# Patient Record
Sex: Female | Born: 1965 | Race: White | Hispanic: No | Smoking: Former smoker
Health system: Southern US, Community
[De-identification: ages and names within clinical notes are randomized; demographics above are authoritative.]

## PROBLEM LIST (undated history)

## (undated) DIAGNOSIS — F410 Panic disorder [episodic paroxysmal anxiety] without agoraphobia: Secondary | ICD-10-CM

## (undated) DIAGNOSIS — E119 Type 2 diabetes mellitus without complications: Secondary | ICD-10-CM

## (undated) DIAGNOSIS — Z87442 Personal history of urinary calculi: Secondary | ICD-10-CM

## (undated) DIAGNOSIS — D649 Anemia, unspecified: Secondary | ICD-10-CM

## (undated) DIAGNOSIS — M1990 Unspecified osteoarthritis, unspecified site: Secondary | ICD-10-CM

## (undated) DIAGNOSIS — I10 Essential (primary) hypertension: Secondary | ICD-10-CM

## (undated) DIAGNOSIS — J189 Pneumonia, unspecified organism: Secondary | ICD-10-CM

## (undated) DIAGNOSIS — R0600 Dyspnea, unspecified: Secondary | ICD-10-CM

## (undated) HISTORY — PX: WISDOM TOOTH EXTRACTION: SHX21

## (undated) HISTORY — PX: BREAST SURGERY: SHX581

---

## 2004-03-29 ENCOUNTER — Emergency Department: Payer: Self-pay | Admitting: Emergency Medicine

## 2004-04-01 ENCOUNTER — Emergency Department: Payer: Self-pay | Admitting: Emergency Medicine

## 2007-09-22 ENCOUNTER — Encounter: Admission: RE | Admit: 2007-09-22 | Discharge: 2007-09-22 | Payer: Self-pay | Admitting: Family Medicine

## 2010-03-05 ENCOUNTER — Encounter (HOSPITAL_BASED_OUTPATIENT_CLINIC_OR_DEPARTMENT_OTHER)
Admission: RE | Admit: 2010-03-05 | Discharge: 2010-03-05 | Disposition: A | Discharge status: Home / Self Care | Payer: BC Managed Care – PPO | Source: Ambulatory Visit | Attending: Surgery | Admitting: Surgery

## 2010-03-05 DIAGNOSIS — Z01812 Encounter for preprocedural laboratory examination: Secondary | ICD-10-CM | POA: Insufficient documentation

## 2010-03-05 DIAGNOSIS — Z01810 Encounter for preprocedural cardiovascular examination: Secondary | ICD-10-CM | POA: Insufficient documentation

## 2010-03-05 LAB — BASIC METABOLIC PANEL
BUN: 11 mg/dL (ref 6–23)
CO2: 29 mEq/L (ref 19–32)
Chloride: 101 mEq/L (ref 96–112)
Creatinine, Ser: 0.85 mg/dL (ref 0.4–1.2)
Potassium: 4.8 mEq/L (ref 3.5–5.1)

## 2010-03-06 ENCOUNTER — Other Ambulatory Visit: Payer: Self-pay | Admitting: Surgery

## 2010-03-06 ENCOUNTER — Ambulatory Visit (HOSPITAL_BASED_OUTPATIENT_CLINIC_OR_DEPARTMENT_OTHER)
Admission: RE | Admit: 2010-03-06 | Discharge: 2010-03-06 | Disposition: A | Discharge status: Home / Self Care | Payer: BC Managed Care – PPO | Source: Ambulatory Visit | Attending: Surgery | Admitting: Surgery

## 2010-03-06 DIAGNOSIS — K219 Gastro-esophageal reflux disease without esophagitis: Secondary | ICD-10-CM | POA: Insufficient documentation

## 2010-03-06 DIAGNOSIS — J449 Chronic obstructive pulmonary disease, unspecified: Secondary | ICD-10-CM | POA: Insufficient documentation

## 2010-03-06 DIAGNOSIS — N61 Mastitis without abscess: Secondary | ICD-10-CM | POA: Insufficient documentation

## 2010-03-06 DIAGNOSIS — Z01812 Encounter for preprocedural laboratory examination: Secondary | ICD-10-CM | POA: Insufficient documentation

## 2010-03-06 DIAGNOSIS — F17200 Nicotine dependence, unspecified, uncomplicated: Secondary | ICD-10-CM | POA: Insufficient documentation

## 2010-03-06 DIAGNOSIS — E669 Obesity, unspecified: Secondary | ICD-10-CM | POA: Insufficient documentation

## 2010-03-06 DIAGNOSIS — Z01818 Encounter for other preprocedural examination: Secondary | ICD-10-CM | POA: Insufficient documentation

## 2010-03-06 DIAGNOSIS — J4489 Other specified chronic obstructive pulmonary disease: Secondary | ICD-10-CM | POA: Insufficient documentation

## 2010-03-06 DIAGNOSIS — I10 Essential (primary) hypertension: Secondary | ICD-10-CM | POA: Insufficient documentation

## 2010-03-08 LAB — WOUND CULTURE: Gram Stain: NONE SEEN

## 2010-03-11 LAB — ANAEROBIC CULTURE

## 2010-03-11 NOTE — Op Note (Signed)
  NAME:  Elizabeth Daniel, Elizabeth Daniel NO.:  [id number]  MEDICAL RECORD NO.:  [id number]           PATIENT TYPE:  LOCATION:                                 FACILITY:  PHYSICIAN:  Thornton Park. Daphine Deutscher, MD       DATE OF BIRTH:  DATE OF PROCEDURE:  03/06/2010 DATE OF DISCHARGE:                              OPERATIVE REPORT   PREOPERATIVE DIAGNOSIS:  Recurrent right breast mastitis.  POSTOPERATIVE DIAGNOSIS:  Recurrent right breast mastitis.  DESCRIPTION OF PROCEDURE:  This 45 year old white female was taken to room 80 [facility].  We marked the area of her breast which was pretty obvious.  She has had recurrent changes there and had some question she had Paget disease or mastitis and since I saw her last she had actually had a bout of infection drained by Dr. Michaell Cowing in my office. After I prepped her breast with PC max and draped it, I created an ellipse of tissue excising the applied spot of drainage and getting someof the skin change and then cutting down and then undermining underneath the nipple to get this indurated mass tissue that was probably mastitis. I removed it beneath that quadrant of the nipple.  This incision ran from about the 11 o'clock to 2 o'clock positions on the right side.  I went ahead and cut it out in toto.  There is also a drop of purulent- like material came out through the center portion of the nipple and cultured that for aerobes and anaerobes.  I went ahead and had cut back to fresh tissue.  I went ahead and irrigated well and I cauterized so there was no bleeding.  After irrigating with several bulb syringes of saline, I went ahead and closed it from either end toward the middle with 4-0 Vicryl and then I packed through the center portion with a piece of 0.25% Iodoform gauze which I left inside the breast about probably an 1-1/2 and then left a bit at least an inch on the outside. I instructed her mother to have her remove the dressing tomorrow  and pull that out with it.  I will keep her on some Keflex 500 mg twice a day for a week and we will see her back in the office in 2-3 weeks. Also given was a prescription for Tylox to take for pain.  Condition improved.     Thornton Park Daphine Deutscher, MD     MBM/MEDQ  D:  03/06/2010  T:  03/06/2010  Job:  [id number]  cc:   Aida Puffer  Electronically Signed by Luretha Murphy MD on 03/11/2010 01:35:21 PM

## 2013-04-03 ENCOUNTER — Emergency Department (HOSPITAL_COMMUNITY)
Admission: EM | Admit: 2013-04-03 | Discharge: 2013-04-03 | Disposition: A | Discharge status: Home / Self Care | Payer: BC Managed Care – PPO | Attending: Emergency Medicine | Admitting: Emergency Medicine

## 2013-04-03 ENCOUNTER — Encounter (HOSPITAL_COMMUNITY): Payer: Self-pay | Admitting: Emergency Medicine

## 2013-04-03 ENCOUNTER — Emergency Department (HOSPITAL_COMMUNITY): Payer: BC Managed Care – PPO

## 2013-04-03 DIAGNOSIS — I10 Essential (primary) hypertension: Secondary | ICD-10-CM | POA: Insufficient documentation

## 2013-04-03 DIAGNOSIS — Z880 Allergy status to penicillin: Secondary | ICD-10-CM | POA: Insufficient documentation

## 2013-04-03 DIAGNOSIS — Z87891 Personal history of nicotine dependence: Secondary | ICD-10-CM | POA: Insufficient documentation

## 2013-04-03 DIAGNOSIS — M940 Chondrocostal junction syndrome [Tietze]: Secondary | ICD-10-CM | POA: Insufficient documentation

## 2013-04-03 DIAGNOSIS — E663 Overweight: Secondary | ICD-10-CM | POA: Insufficient documentation

## 2013-04-03 DIAGNOSIS — Z79899 Other long term (current) drug therapy: Secondary | ICD-10-CM | POA: Insufficient documentation

## 2013-04-03 HISTORY — DX: Essential (primary) hypertension: I10

## 2013-04-03 LAB — URINALYSIS, ROUTINE W REFLEX MICROSCOPIC
BILIRUBIN URINE: NEGATIVE
Glucose, UA: NEGATIVE mg/dL
Hgb urine dipstick: NEGATIVE
KETONES UR: NEGATIVE mg/dL
Leukocytes, UA: NEGATIVE
NITRITE: NEGATIVE
Protein, ur: NEGATIVE mg/dL
SPECIFIC GRAVITY, URINE: 1.01 (ref 1.005–1.030)
UROBILINOGEN UA: 0.2 mg/dL (ref 0.0–1.0)
pH: 6.5 (ref 5.0–8.0)

## 2013-04-03 MED ORDER — OXYCODONE-ACETAMINOPHEN 5-325 MG PO TABS
1.0000 | ORAL_TABLET | Freq: Once | ORAL | Status: AC
  Administered 2013-04-03: 1 via ORAL
  Filled 2013-04-03: qty 1

## 2013-04-03 MED ORDER — IBUPROFEN 800 MG PO TABS: 800.0000 mg | ORAL_TABLET | Freq: Three times a day (TID) | ORAL | Status: DC

## 2013-04-03 MED ORDER — OXYCODONE-ACETAMINOPHEN 5-325 MG PO TABS: 2.0000 | ORAL_TABLET | Freq: Four times a day (QID) | ORAL | Status: DC | PRN

## 2013-04-03 NOTE — ED Notes (Signed)
Pt from home c/o right sided pain that is worse with deep breaths that began Thursday. She denies injury but reports sitting more to one side then having the pain.

## 2013-04-03 NOTE — ED Provider Notes (Signed)
CSN: [id number]     Arrival date & time 04/03/13  1142 History   First MD Initiated Contact with Patient 04/03/13 1219     Chief Complaint  Patient presents with  . Flank Pain     (Consider location/radiation/quality/duration/timing/severity/associated sxs/prior Treatment) The history is provided by the patient.  Elizabeth Daniel is a 48 y.o. female hx of HTN here with R side pain. She was visiting someone in the hospital 3 days ago and was leaning on the right side and was reading a book for several hours. Afterwards she notes some pain when she takes a deep breath. Has been taking Tylenol with minimal relief. Denies shortness of breath when she sits. Eyes any recent travels or history of blood clots. Denies any abdominal pain or nausea or vomiting. Denies any hematuria or dysuria. Pain is not worse when she eats.   Past Medical History  Diagnosis Date  . Hypertension    History reviewed. No pertinent past surgical history. No family history on file. History  Substance Use Topics  . Smoking status: Former Smoker -- 20 years    Types: Cigarettes  . Smokeless tobacco: Not on file  . Alcohol Use: No   OB History   Grav Para Term Preterm Abortions TAB SAB Ect Mult Living                 Review of Systems  Musculoskeletal:       R side pain   All other systems reviewed and are negative.      Allergies  Penicillins  Home Medications   Current Outpatient Rx  Name  Route  Sig  Dispense  Refill  . ALPRAZolam (XANAX) 1 MG tablet   Oral   Take 1 mg by mouth 3 (three) times daily as needed for anxiety.          . Azilsartan Medoxomil (EDARBI) 40 MG TABS   Oral   Take 20 mg by mouth every evening.         . hydrochlorothiazide (HYDRODIURIL) 25 MG tablet   Oral   Take 25 mg by mouth daily.           BP 140/84  Pulse 95  Temp(Src) 98.6 F (37 C) (Oral)  Resp 18  SpO2 95% Physical Exam  Nursing note and vitals reviewed. Constitutional: She is oriented to  person, place, and time. She appears well-developed and well-nourished.  Overweight, slightly uncomfortable   HENT:  Head: Normocephalic.  Mouth/Throat: Oropharynx is clear and moist.  Eyes: Conjunctivae are normal. Pupils are equal, round, and reactive to light.  Neck: Normal range of motion. Neck supple.  Cardiovascular: Normal rate, regular rhythm and normal heart sounds.   Pulmonary/Chest: Effort normal and breath sounds normal. No respiratory distress. She has no wheezes. She has no rales.  + reproducible tenderness R 12th rib area.   Abdominal: Soft. Bowel sounds are normal. She exhibits no distension. There is no tenderness. There is no rebound.  No RUQ tenderness, no murphy's sign   Musculoskeletal: Normal range of motion. She exhibits no edema and no tenderness.  Neurological: She is alert and oriented to person, place, and time. No cranial nerve deficit. Coordination normal.  Skin: Skin is warm and dry.  Psychiatric: She has a normal mood and affect. Her behavior is normal. Judgment and thought content normal.    ED Course  Procedures (including critical care time)   EMERGENCY DEPARTMENT US GALLBLADDER INTERPRETATION "Study: Limited Ultrasound of the gallbladder and  common bile duct."  INDICATIONS: Abdominal pain Indication: Multiple views of the gallbladder and common bile duct are obtained with a  Multi-frequency probe."  PERFORMED BY:  Myself  IMAGES ARCHIVED?: Yes  FINDINGS: Gallstones present, Gallbladder wall normal in thickness, Sonographic Murphy's sign absent and Common bile duct normal in size  LIMITATIONS: Body Habitus  INTERPRETATION: Cholelithiasis  COMMENT:  Single gallstone, no cholecystitis    EMERGENCY DEPARTMENT US RENAL INTERPRETATION "Study: Limited Ultrasound of the gallbladder and common bile duct."  INDICATIONS: RUQ pain Indication: Multiple views of the R kidney are obtained with a  Multi-frequency probe."  PERFORMED BY:   Myself  IMAGES ARCHIVED?: Yes  FINDINGS: R kidney with no hydro  LIMITATIONS: Body Habitus  INTERPRETATION: Normal  COMMENT:  No hydro   Labs Review Labs Reviewed  URINALYSIS, ROUTINE W REFLEX MICROSCOPIC - Abnormal; Notable for the following:    APPearance CLOUDY (*)    All other components within normal limits   Imaging Review Dg Ribs Unilateral W/chest Right  04/03/2013   CLINICAL DATA:  Right rib pain on lower right ribs  EXAM: RIGHT RIBS AND CHEST - 3+ VIEW  COMPARISON:  None.  FINDINGS: No fracture or other bone lesions are seen involving the ribs. There is no evidence of pneumothorax or pleural effusion. Both lungs are clear. Heart size and mediastinal contours are within normal limits. No pneumothorax.  IMPRESSION: No acute findings.   Electronically Signed   By: Charlett NoseKevin  Daniel M.D.   On: 04/03/2013 13:04     EKG Interpretation None      MDM   Final diagnoses:  None   Elizabeth NajjarLaurie Daniel is a 48 y.o. female here with R rib pain s/p leaning on it. I think likely costochondritis. Will get xrays. Will get UA to assess for hematuria although I have low suspicion for stone. I doubt gallbladder pathology. PERC neg, I doubt PE. I also don't think she has ACS or dissection.    1:30 PM Xray unremarkable. UA nl, no hydro R kidney. Single gallstone at the top of the gallbladder with no signs of acute chole. I don't think it explains her symptoms. Likely costochondritis. Will give pain meds and have her f/u with PMD.   Elizabeth Canalavid H Jayvien Rowlette, MD 04/03/13 1331

## 2013-04-03 NOTE — Discharge Instructions (Signed)
Take motrin 800 mg every 6 hrs for the next few days then as needed.   Take percocet as prescribed for severe pain. Do NOT drive with it.   You have a single gallstone but it is not causing any inflammation of your gallbladder.   Follow up with your doctor.   Return to ER if you have severe pain, vomiting, fevers.    Costochondritis Costochondritis is a condition in which the tissue (cartilage) that connects your ribs with your breastbone (sternum) becomes irritated. It causes pain in the chest and rib area. It usually goes away on its own over time. HOME CARE  Avoid activities that wear you out.  Do not strain your ribs. Avoid activities that use your:  Chest.  Belly.  Side muscles.  Put ice on the area for the first 2 days after the pain starts.  Put ice in a plastic bag.  Place a towel between your skin and the bag.  Leave the ice on for 20 minutes, 2 3 times a day.  Only take medicine as told by your doctor. GET HELP IF:  You have redness or puffiness (swelling) in the rib area.  Your pain does not go away with rest or medicine. GET HELP RIGHT AWAY IF:   Your pain gets worse.  You are very uncomfortable.  You have trouble breathing.  You cough up blood.  You start sweating or throwing up (vomiting).  You have a fever or lasting symptoms for more than 2 3 days.  You have a fever and your symptoms suddenly get worse. MAKE SURE YOU:   Understand these instructions.  Will watch your condition.  Will get help right away if you are not doing well or get worse. Document Released: 06/11/2007 Document Revised: 08/25/2012 Document Reviewed: 07/27/2012 [facility] Patient Information 2014 [place], [place].

## 2016-02-13 ENCOUNTER — Encounter (HOSPITAL_COMMUNITY): Payer: Self-pay | Admitting: *Deleted

## 2016-02-13 ENCOUNTER — Emergency Department (HOSPITAL_COMMUNITY)
Admission: EM | Admit: 2016-02-13 | Discharge: 2016-02-13 | Disposition: A | Discharge status: Home / Self Care | Payer: BLUE CROSS/BLUE SHIELD | Attending: Emergency Medicine | Admitting: Emergency Medicine

## 2016-02-13 ENCOUNTER — Other Ambulatory Visit: Payer: Self-pay

## 2016-02-13 ENCOUNTER — Emergency Department (HOSPITAL_COMMUNITY): Payer: BLUE CROSS/BLUE SHIELD

## 2016-02-13 DIAGNOSIS — I10 Essential (primary) hypertension: Secondary | ICD-10-CM | POA: Diagnosis not present

## 2016-02-13 DIAGNOSIS — R0689 Other abnormalities of breathing: Secondary | ICD-10-CM | POA: Diagnosis not present

## 2016-02-13 DIAGNOSIS — M7989 Other specified soft tissue disorders: Secondary | ICD-10-CM | POA: Diagnosis present

## 2016-02-13 DIAGNOSIS — R609 Edema, unspecified: Secondary | ICD-10-CM

## 2016-02-13 DIAGNOSIS — Z87891 Personal history of nicotine dependence: Secondary | ICD-10-CM | POA: Diagnosis not present

## 2016-02-13 DIAGNOSIS — Z79899 Other long term (current) drug therapy: Secondary | ICD-10-CM | POA: Insufficient documentation

## 2016-02-13 DIAGNOSIS — E119 Type 2 diabetes mellitus without complications: Secondary | ICD-10-CM | POA: Diagnosis not present

## 2016-02-13 DIAGNOSIS — R600 Localized edema: Secondary | ICD-10-CM | POA: Diagnosis not present

## 2016-02-13 HISTORY — DX: Type 2 diabetes mellitus without complications: E11.9

## 2016-02-13 HISTORY — DX: Panic disorder (episodic paroxysmal anxiety): F41.0

## 2016-02-13 LAB — BASIC METABOLIC PANEL
Anion gap: 10 (ref 5–15)
BUN: <5 mg/dL — ABNORMAL LOW (ref 6–20)
CO2: 26 mmol/L (ref 22–32)
Calcium: 9.2 mg/dL (ref 8.9–10.3)
Chloride: 104 mmol/L (ref 101–111)
Creatinine, Ser: 0.58 mg/dL (ref 0.44–1.00)
GFR calc Af Amer: >60 mL/min (ref >60)
GFR calc non Af Amer: >60 mL/min (ref >60)
Glucose, Bld: 183 mg/dL — ABNORMAL HIGH (ref 65–99)
Potassium: 3.7 mmol/L (ref 3.5–5.1)
Sodium: 140 mmol/L (ref 135–145)

## 2016-02-13 LAB — URINALYSIS, ROUTINE W REFLEX MICROSCOPIC
Bilirubin Urine: NEGATIVE
Glucose, UA: NEGATIVE mg/dL
Ketones, ur: NEGATIVE mg/dL
Nitrite: NEGATIVE
Protein, ur: NEGATIVE mg/dL
Specific Gravity, Urine: 1.006 (ref 1.005–1.030)
pH: 6 (ref 5.0–8.0)

## 2016-02-13 LAB — CBC WITH DIFFERENTIAL/PLATELET
Basophils Absolute: 0 10*3/uL (ref 0.0–0.1)
Basophils Relative: 1 %
Eosinophils Absolute: 0.1 10*3/uL (ref 0.0–0.7)
Eosinophils Relative: 2 %
HCT: 38.1 % (ref 36.0–46.0)
Hemoglobin: 12.5 g/dL (ref 12.0–15.0)
Lymphocytes Relative: 25 %
Lymphs Abs: 1.9 10*3/uL (ref 0.7–4.0)
MCH: 30.9 pg (ref 26.0–34.0)
MCHC: 32.8 g/dL (ref 30.0–36.0)
MCV: 94.3 fL (ref 78.0–100.0)
Monocytes Absolute: 0.6 10*3/uL (ref 0.1–1.0)
Monocytes Relative: 8 %
Neutro Abs: 4.8 10*3/uL (ref 1.7–7.7)
Neutrophils Relative %: 64 %
Platelets: 185 10*3/uL (ref 150–400)
RBC: 4.04 MIL/uL (ref 3.87–5.11)
RDW: 12.8 % (ref 11.5–15.5)
WBC: 7.5 10*3/uL (ref 4.0–10.5)

## 2016-02-13 LAB — BRAIN NATRIURETIC PEPTIDE: B Natriuretic Peptide: 87.2 pg/mL (ref 0.0–100.0)

## 2016-02-13 MED ORDER — POTASSIUM CHLORIDE CRYS ER 20 MEQ PO TBCR: 20.0000 meq | EXTENDED_RELEASE_TABLET | Freq: Two times a day (BID) | ORAL | 0 refills | Status: DC

## 2016-02-13 MED ORDER — FUROSEMIDE 20 MG PO TABS: 20.0000 mg | ORAL_TABLET | Freq: Two times a day (BID) | ORAL | 0 refills | Status: DC

## 2016-02-13 NOTE — ED Provider Notes (Signed)
[facility]-EMERGENCY DEPT Provider Note    By signing my name below, I, Earmon Phoenix, attest that this documentation has been prepared under the direction and in the presence of Raeford Razor, MD. Electronically Signed: Earmon Phoenix, ED Scribe. 02/13/16. 3:30 PM.    History   Chief Complaint Chief Complaint  Patient presents with  . Leg Swelling  . Cough    HPI  Elizabeth Daniel is a 51 y.o. female with PMHx of DM, HTN who presents to the Emergency Department complaining of fluid retention in all extremities, especially feet that began about one week ago. She also reports cold symptoms including orthopnea, congestion, cough. She also reports SOB but also notes her recent cold symptoms. She states she was taking HCTZ for several years but was getting severe cramping in all extremities. Here PCP discontinued this recently and has been swelling since. She has not taken anything for symptom relief. She denies modifying factors. She denies fever, chills, nausea, vomiting. She has an appt with her PCP in two day on 02/15/16.   Past Medical History:  Diagnosis Date  . Diabetes mellitus without complication (HCC)   . Hypertension   . Panic attacks     There are no active problems to display for this patient.   History reviewed. No pertinent surgical history.  OB History    No data available       Home Medications    Prior to Admission medications   Medication Sig Start Date End Date Taking? Authorizing Provider  ALPRAZolam Prudy Feeler) 1 MG tablet Take 1 mg by mouth 3 (three) times daily as needed for anxiety.  03/11/13   Historical Provider, MD  Azilsartan Medoxomil (EDARBI) 40 MG TABS Take 20 mg by mouth every evening.    Historical Provider, MD  hydrochlorothiazide (HYDRODIURIL) 25 MG tablet Take 25 mg by mouth daily.  03/30/13   Historical Provider, MD  ibuprofen (ADVIL,MOTRIN) 800 MG tablet Take 1 tablet (800 mg total) by mouth 3 (three) times daily. 04/03/13   Charlynne Pander, MD    oxyCODONE-acetaminophen (PERCOCET) 5-325 MG per tablet Take 2 tablets by mouth every 6 (six) hours as needed. 04/03/13   Charlynne Pander, MD    Family History No family history on file.  Social History Social History  Substance Use Topics  . Smoking status: Former Smoker    Years: 20.00    Types: Cigarettes    Quit date: 02/12/2014  . Smokeless tobacco: Never Used  . Alcohol use No     Allergies   Penicillins   Review of Systems Review of Systems A complete 10 system review of systems was obtained and all systems are negative except as noted in the HPI and PMH.    Physical Exam Updated Vital Signs BP (!) 169/104 (BP Location: Left Arm)   Pulse 96   Resp 16   Ht 5' 4.5" (1.638 m)   Wt 135 lb (61.2 kg)   SpO2 98%   BMI 22.81 kg/m   Physical Exam  Constitutional: She is oriented to person, place, and time. She appears well-developed and well-nourished. No distress.  HENT:  Head: Normocephalic and atraumatic.  Eyes: EOM are normal.  Neck: Normal range of motion.  Cardiovascular: Normal rate, regular rhythm and normal heart sounds.   Pulmonary/Chest: Effort normal and breath sounds normal.  Abdominal: Soft. She exhibits no distension. There is no tenderness.  Musculoskeletal: Normal range of motion. She exhibits edema.  Symmetric pitting bilateral lower extremity edema.  Neurological: She  is alert and oriented to person, place, and time.  Skin: Skin is warm and dry.  Psychiatric: She has a normal mood and affect. Judgment normal.  Nursing note and vitals reviewed.    ED Treatments / Results  DIAGNOSTIC STUDIES: Oxygen Saturation is 98% on RA, normal by my interpretation.   COORDINATION OF CARE: 3:26 PM- Will prescribe Lasix and potassium. Advised pt to use compression hose for swelling and follow up with PCP at schedule appt in two days. Pt verbalizes understanding and agrees to plan.  Medications - No data to display  Labs (all labs ordered are listed,  but only abnormal results are displayed) Labs Reviewed  BASIC METABOLIC PANEL - Abnormal; Notable for the following:       Result Value   Glucose, Bld 183 (*)    BUN <5 (*)    All other components within normal limits  URINALYSIS, ROUTINE W REFLEX MICROSCOPIC - Abnormal; Notable for the following:    Hgb urine dipstick SMALL (*)    Leukocytes, UA MODERATE (*)    Bacteria, UA MANY (*)    Squamous Epithelial / LPF 0-5 (*)    All other components within normal limits  CBC WITH DIFFERENTIAL/PLATELET  BRAIN NATRIURETIC PEPTIDE    EKG  EKG Interpretation None       Radiology Dg Chest 2 View  Result Date: 02/13/2016 CLINICAL DATA:  Cough for several days, initial encounter EXAM: CHEST  2 VIEW COMPARISON:  04/03/2013 FINDINGS: Cardiac shadow is within normal limits. The lungs are well aerated bilaterally. Mild interstitial changes are seen but stable from the previous study. No bony abnormality is seen. IMPRESSION: No acute abnormality noted. Electronically Signed   By: Alcide CleverMark  Lukens M.D.   On: 02/13/2016 13:48    Procedures Procedures (including critical care time)  Medications Ordered in ED Medications - No data to display   Initial Impression / Assessment and Plan / ED Course  I have reviewed the triage vital signs and the nursing notes.  Pertinent labs & imaging results that were available during my care of the patient were reviewed by me and considered in my medical decision making (see chart for details).  I personally preformed the services scribed in my presence. The recorded information has been reviewed is accurate. Raeford RazorStephen Jamariyah Johannsen, MD.    Final Clinical Impressions(s) / ED Diagnoses   Final diagnoses:  Peripheral edema    New Prescriptions New Prescriptions   No medications on file     Raeford RazorStephen Charlie Char, MD 02/25/16 1640

## 2016-02-13 NOTE — ED Notes (Signed)
Pt stable, understands discharge instructions, and reasons for return.   

## 2016-02-13 NOTE — ED Triage Notes (Signed)
Pt states was taken off of HCTZ because she was cramping.  Since then she has noticed swelling to all extremities.  Her MD has ordered an X-ray for chf and a-fib symptoms, per pt.  Pt c/o of recent cough with some sob.

## 2016-02-13 NOTE — ED Notes (Signed)
Pt instructed to collect urine sample at triage.

## 2016-10-15 ENCOUNTER — Other Ambulatory Visit: Payer: Self-pay | Admitting: Nurse Practitioner

## 2016-10-15 ENCOUNTER — Ambulatory Visit
Admission: RE | Admit: 2016-10-15 | Discharge: 2016-10-15 | Disposition: A | Discharge status: Home / Self Care | Payer: BLUE CROSS/BLUE SHIELD | Source: Ambulatory Visit | Attending: Nurse Practitioner | Admitting: Nurse Practitioner

## 2016-10-15 DIAGNOSIS — R112 Nausea with vomiting, unspecified: Secondary | ICD-10-CM | POA: Insufficient documentation

## 2016-10-15 DIAGNOSIS — K760 Fatty (change of) liver, not elsewhere classified: Secondary | ICD-10-CM | POA: Diagnosis not present

## 2016-10-15 DIAGNOSIS — K8020 Calculus of gallbladder without cholecystitis without obstruction: Secondary | ICD-10-CM | POA: Diagnosis not present

## 2016-10-15 DIAGNOSIS — R1010 Upper abdominal pain, unspecified: Secondary | ICD-10-CM | POA: Diagnosis present

## 2016-10-15 DIAGNOSIS — N132 Hydronephrosis with renal and ureteral calculous obstruction: Secondary | ICD-10-CM | POA: Diagnosis not present

## 2016-10-15 DIAGNOSIS — I700 Atherosclerosis of aorta: Secondary | ICD-10-CM | POA: Insufficient documentation

## 2016-10-15 MED ORDER — IOPAMIDOL (ISOVUE-300) INJECTION 61%
100.0000 mL | Freq: Once | INTRAVENOUS | Status: AC | PRN
  Administered 2016-10-15: 100 mL via INTRAVENOUS

## 2016-10-20 ENCOUNTER — Emergency Department
Admission: EM | Admit: 2016-10-20 | Discharge: 2016-10-20 | Disposition: A | Discharge status: Home / Self Care | Payer: BLUE CROSS/BLUE SHIELD | Attending: Emergency Medicine | Admitting: Emergency Medicine

## 2016-10-20 ENCOUNTER — Emergency Department: Payer: BLUE CROSS/BLUE SHIELD

## 2016-10-20 DIAGNOSIS — S20219A Contusion of unspecified front wall of thorax, initial encounter: Secondary | ICD-10-CM | POA: Insufficient documentation

## 2016-10-20 DIAGNOSIS — Z87891 Personal history of nicotine dependence: Secondary | ICD-10-CM | POA: Insufficient documentation

## 2016-10-20 DIAGNOSIS — Y999 Unspecified external cause status: Secondary | ICD-10-CM | POA: Diagnosis not present

## 2016-10-20 DIAGNOSIS — W51XXXA Accidental striking against or bumped into by another person, initial encounter: Secondary | ICD-10-CM | POA: Diagnosis not present

## 2016-10-20 DIAGNOSIS — S20211A Contusion of right front wall of thorax, initial encounter: Secondary | ICD-10-CM

## 2016-10-20 DIAGNOSIS — Y9383 Activity, rough housing and horseplay: Secondary | ICD-10-CM | POA: Diagnosis not present

## 2016-10-20 DIAGNOSIS — Z79899 Other long term (current) drug therapy: Secondary | ICD-10-CM | POA: Diagnosis not present

## 2016-10-20 DIAGNOSIS — E119 Type 2 diabetes mellitus without complications: Secondary | ICD-10-CM | POA: Diagnosis not present

## 2016-10-20 DIAGNOSIS — R52 Pain, unspecified: Secondary | ICD-10-CM

## 2016-10-20 DIAGNOSIS — Y929 Unspecified place or not applicable: Secondary | ICD-10-CM | POA: Insufficient documentation

## 2016-10-20 DIAGNOSIS — R0789 Other chest pain: Secondary | ICD-10-CM | POA: Diagnosis present

## 2016-10-20 DIAGNOSIS — I10 Essential (primary) hypertension: Secondary | ICD-10-CM | POA: Insufficient documentation

## 2016-10-20 MED ORDER — TRAMADOL HCL 50 MG PO TABS: 50.0000 mg | ORAL_TABLET | Freq: Four times a day (QID) | ORAL | 0 refills | Status: AC | PRN

## 2016-10-20 NOTE — ED Notes (Signed)
Pt discharged to home.  Family member driving.  Discharge instructions reviewed.  Verbalized understanding.  No questions or concerns at this time.  Teach back verified.  Pt in NAD.  No items left in ED.   

## 2016-10-20 NOTE — ED Provider Notes (Signed)
ED ECG REPORT I, Jene Every, the attending physician, personally viewed and interpreted this ECG.  Date: 10/20/2016  Rate: 71 Rhythm: normal sinus rhythm QRS Axis: normal Intervals: normal ST/T Wave abnormalities: normal Narrative Interpretation: no evidence of acute ischemia    Jene Every, MD 10/20/16 1413

## 2016-10-20 NOTE — ED Provider Notes (Signed)
[facility] Emergency Department Provider Note   ____________________________________________   First MD Initiated Contact with Patient 10/20/16 1431     (approximate)  I have reviewed the triage vital signs and the nursing notes.   HISTORY  Chief Complaint Rib Injury   HPI Elizabeth Daniel is a 51 y.o. female is here complaining of right rib pain for the last 4-5 days. Patient states that she was playing around with a friend who was tickling her and accidentally rolled over onto her. Patient began having right rib pain at that time. She also is has lifted dog food which made this area feel worse. There is increased pain with deep inspiration and worse with coughing. Patient has a history of rib injuries in the past.currently she rates her pain as 7 out of 10.   Past Medical History:  Diagnosis Date  . Diabetes mellitus without complication (HCC)   . Hypertension   . Panic attacks     There are no active problems to display for this patient.   No past surgical history on file.  Prior to Admission medications   Medication Sig Start Date End Date Taking? Authorizing Provider  ALPRAZolam Prudy Feeler) 1 MG tablet Take 1 mg by mouth 3 (three) times daily as needed for anxiety.  03/11/13   [provider]  Azilsartan Medoxomil (EDARBI) 40 MG TABS Take 20 mg by mouth every evening.    [provider]  furosemide (LASIX) 20 MG tablet Take 1 tablet (20 mg total) by mouth 2 (two) times daily. 02/13/16   Raeford Razor, MD  ibuprofen (ADVIL,MOTRIN) 800 MG tablet Take 1 tablet (800 mg total) by mouth 3 (three) times daily. 04/03/13   Charlynne Pander, MD  potassium chloride SA (K-DUR,KLOR-CON) 20 MEQ tablet Take 1 tablet (20 mEq total) by mouth 2 (two) times daily. 02/13/16   Raeford Razor, MD  traMADol (ULTRAM) 50 MG tablet Take 1 tablet (50 mg total) by mouth every 6 (six) hours as needed. 10/20/16 10/20/17  Tommi Rumps, PA-C     Allergies Penicillins  No family history on file.  Social History Social History  Substance Use Topics  . Smoking status: Former Smoker    Years: 20.00    Types: Cigarettes    Quit date: 02/12/2014  . Smokeless tobacco: Never Used  . Alcohol use No    Review of Systems Constitutional: No fever/chills Cardiovascular: Denies chest pain. Respiratory: Denies shortness of breath. Gastrointestinal: No abdominal pain.  No nausea, no vomiting.  Genitourinary: Negative for dysuria. Musculoskeletal: positive for right rib pain. Skin: Negative for injury. Neurological: Negative for  focal weakness or numbness.   ____________________________________________   PHYSICAL EXAM:  VITAL SIGNS: ED Triage Vitals  Enc Vitals Group     BP 10/20/16 1405 107/75     Pulse Rate 10/20/16 1405 82     Resp 10/20/16 1405 16     Temp 10/20/16 1405 98.1 F (36.7 C)     Temp Source 10/20/16 1405 Oral     SpO2 10/20/16 1405 100 %     Weight 10/20/16 1403 195 lb (88.5 kg)     Height 10/20/16 1403  (1.626 m)     Head Circumference --      Peak Flow --      Pain Score 10/20/16 1403 7     Pain Loc --      Pain Edu? --      Excl. in GC? --  Constitutional: Alert and oriented. Well appearing and in no acute distress. Eyes: Conjunctivae are normal.  Head: Atraumatic. Neck: No stridor.   Cardiovascular: Normal rate, regular rhythm. Grossly normal heart sounds.  Good peripheral circulation. On examination of the right ribs there is no gross deformity, soft tissue swelling or ecchymosis noted. There is marked tenderness on palpation of the lateral aspect below the seventh and eighth ribs on the right. Respiratory: Normal respiratory effort.  No retractions. Lungs CTAB. Gastrointestinal: Soft and nontender. No distention. Musculoskeletal: Ms. Patton Salles and lower extremity swelling difficulty. Normal gait was noted. Neurologic:  Normal speech and language. No gross focal neurologic deficits are  appreciated. No gait instability. Skin:  Skin is warm, dry and intact. No ecchymosis, erythema or abrasions noted. Psychiatric: Mood and affect are normal. Speech and behavior are normal.  ____________________________________________   LABS (all labs ordered are listed, but only abnormal results are displayed)  Labs Reviewed - No data to display ____________________________________________  EKG  Reviewed by Dr. Cyril Loosen Normal sinus rhythm with a ventricular rate of 65. ____________________________________________  RADIOLOGY  Dg Chest 2 View  Result Date: 10/20/2016 CLINICAL DATA:  Right-sided chest pain EXAM: CHEST  2 VIEW COMPARISON:  Chest radiograph 02/13/2016 FINDINGS: The heart size and mediastinal contours are within normal limits. Both lungs are clear. The visualized skeletal structures are unremarkable. IMPRESSION: No active cardiopulmonary disease. Electronically Signed   By: Deatra Robinson M.D.   On: 10/20/2016 14:50   Dg Ribs Unilateral Right  Result Date: 10/20/2016 CLINICAL DATA:  Right rib pain for 4-5 days. EXAM: RIGHT RIBS - 2 VIEW COMPARISON:  Chest x-ray from earlier today FINDINGS: A pain marker is just inferior to the ossified anterior ribs. No fracture deformity or bone lesion is seen. There is a large right renal calculus that is known from recent abdominal CT. Cholelithiasis seen on that study is not visible today. IMPRESSION: 1. Negative right rib series. 2. Known large right renal calculus. Electronically Signed   By: Marnee Spring M.D.   On: 10/20/2016 15:48    ____________________________________________   PROCEDURES  Procedure(s) performed: None  Procedures  Critical Care performed: No  ____________________________________________   INITIAL IMPRESSION / ASSESSMENT AND PLAN / ED COURSE  As part of my medical decision making, I reviewed the following data within the electronic MEDICAL RECORD NUMBER Notes from prior ED visits and [place] Controlled  Substance Database. No recent narcotics have been written. Last narcotic written was oxycodone which patient states causes nausea and vomiting.   Patient given a prescription for tramadol 1 every 6 hours as needed for pain. She is to use ice packs to her ribs with needed for pain control. She is to follow-up with her PCP if any continued problems.   ___________________________________________   FINAL CLINICAL IMPRESSION(S) / ED DIAGNOSES  Final diagnoses:  Pain  Rib contusion, right, initial encounter      NEW MEDICATIONS STARTED DURING THIS VISIT:  New Prescriptions   TRAMADOL (ULTRAM) 50 MG TABLET    Take 1 tablet (50 mg total) by mouth every 6 (six) hours as needed.     Note:  This document was prepared using Dragon voice recognition software and may include unintentional dictation errors.    Tommi Rumps, PA-C 10/20/16 1613    Dionne Bucy, MD 10/22/16 [phone]

## 2016-10-20 NOTE — ED Triage Notes (Signed)
C/O right rib pain x 4-5 days.  Patient describes an incident where a friend was tickling her and rolled over onto patient.  Pain has been ongoing for past 4-5 days, and after lifting a 30 lb bag of dog food today, patient felt pain worsen.  Patient has history of rib fractures and rib muscle injury.  Pain worse with coughing, deep breathing, movement.  Pain is easily reproducible with light palpation.

## 2016-10-20 NOTE — Discharge Instructions (Signed)
Follow-up with your primary care provider if any continued problems with your  right ribs. You  may use ice to the area as needed for pain. Also tramadol 1 tablet every 6 hours as needed for pain. Do not take this medication and drive or operate machinery.

## 2017-01-06 DIAGNOSIS — C801 Malignant (primary) neoplasm, unspecified: Secondary | ICD-10-CM

## 2017-01-06 HISTORY — DX: Malignant (primary) neoplasm, unspecified: C80.1

## 2017-01-09 ENCOUNTER — Other Ambulatory Visit: Payer: Self-pay | Admitting: Urology

## 2017-01-09 ENCOUNTER — Other Ambulatory Visit: Payer: Self-pay | Admitting: Nurse Practitioner

## 2017-01-09 DIAGNOSIS — R946 Abnormal results of thyroid function studies: Secondary | ICD-10-CM

## 2017-01-13 ENCOUNTER — Encounter (HOSPITAL_COMMUNITY): Payer: Self-pay | Admitting: *Deleted

## 2017-01-13 ENCOUNTER — Other Ambulatory Visit: Payer: Self-pay

## 2017-01-14 ENCOUNTER — Ambulatory Visit
Admission: RE | Admit: 2017-01-14 | Discharge: 2017-01-14 | Disposition: A | Discharge status: Home / Self Care | Payer: BLUE CROSS/BLUE SHIELD | Source: Ambulatory Visit | Attending: Nurse Practitioner | Admitting: Nurse Practitioner

## 2017-01-14 ENCOUNTER — Other Ambulatory Visit: Payer: Self-pay | Admitting: Nurse Practitioner

## 2017-01-14 ENCOUNTER — Ambulatory Visit
Admission: RE | Admit: 2017-01-14 | Discharge: 2017-01-14 | Disposition: A | Discharge status: Home / Self Care | Payer: BLUE CROSS/BLUE SHIELD | Source: Ambulatory Visit | Attending: Physician Assistant | Admitting: Physician Assistant

## 2017-01-14 DIAGNOSIS — R634 Abnormal weight loss: Secondary | ICD-10-CM

## 2017-01-14 DIAGNOSIS — R05 Cough: Secondary | ICD-10-CM

## 2017-01-14 DIAGNOSIS — R918 Other nonspecific abnormal finding of lung field: Secondary | ICD-10-CM | POA: Diagnosis not present

## 2017-01-14 DIAGNOSIS — R059 Cough, unspecified: Secondary | ICD-10-CM

## 2017-01-14 DIAGNOSIS — R946 Abnormal results of thyroid function studies: Secondary | ICD-10-CM | POA: Diagnosis present

## 2017-01-15 ENCOUNTER — Encounter (HOSPITAL_COMMUNITY)
Admission: RE | Disposition: A | Discharge status: Home / Self Care | Payer: Self-pay | Source: Ambulatory Visit | Attending: Urology

## 2017-01-15 ENCOUNTER — Ambulatory Visit (HOSPITAL_COMMUNITY): Payer: BLUE CROSS/BLUE SHIELD

## 2017-01-15 ENCOUNTER — Encounter (HOSPITAL_COMMUNITY): Payer: Self-pay | Admitting: General Practice

## 2017-01-15 ENCOUNTER — Ambulatory Visit (HOSPITAL_COMMUNITY)
Admission: RE | Admit: 2017-01-15 | Discharge: 2017-01-15 | Disposition: A | Discharge status: Home / Self Care | Payer: BLUE CROSS/BLUE SHIELD | Source: Ambulatory Visit | Attending: Urology | Admitting: Urology

## 2017-01-15 DIAGNOSIS — M1990 Unspecified osteoarthritis, unspecified site: Secondary | ICD-10-CM | POA: Diagnosis not present

## 2017-01-15 DIAGNOSIS — E669 Obesity, unspecified: Secondary | ICD-10-CM | POA: Diagnosis not present

## 2017-01-15 DIAGNOSIS — I10 Essential (primary) hypertension: Secondary | ICD-10-CM | POA: Diagnosis not present

## 2017-01-15 DIAGNOSIS — Z880 Allergy status to penicillin: Secondary | ICD-10-CM | POA: Insufficient documentation

## 2017-01-15 DIAGNOSIS — F419 Anxiety disorder, unspecified: Secondary | ICD-10-CM | POA: Diagnosis not present

## 2017-01-15 DIAGNOSIS — E119 Type 2 diabetes mellitus without complications: Secondary | ICD-10-CM | POA: Insufficient documentation

## 2017-01-15 DIAGNOSIS — K279 Peptic ulcer, site unspecified, unspecified as acute or chronic, without hemorrhage or perforation: Secondary | ICD-10-CM | POA: Insufficient documentation

## 2017-01-15 DIAGNOSIS — K219 Gastro-esophageal reflux disease without esophagitis: Secondary | ICD-10-CM | POA: Insufficient documentation

## 2017-01-15 DIAGNOSIS — E7800 Pure hypercholesterolemia, unspecified: Secondary | ICD-10-CM | POA: Insufficient documentation

## 2017-01-15 DIAGNOSIS — N200 Calculus of kidney: Secondary | ICD-10-CM | POA: Diagnosis not present

## 2017-01-15 DIAGNOSIS — Z87891 Personal history of nicotine dependence: Secondary | ICD-10-CM | POA: Insufficient documentation

## 2017-01-15 HISTORY — DX: Anemia, unspecified: D64.9

## 2017-01-15 HISTORY — DX: Personal history of urinary calculi: Z87.442

## 2017-01-15 HISTORY — DX: Dyspnea, unspecified: R06.00

## 2017-01-15 HISTORY — PX: EXTRACORPOREAL SHOCK WAVE LITHOTRIPSY: SHX1557

## 2017-01-15 LAB — GLUCOSE, CAPILLARY: Glucose-Capillary: 87 mg/dL (ref 65–99)

## 2017-01-15 SURGERY — LITHOTRIPSY, ESWL
Anesthesia: LOCAL | Laterality: Right

## 2017-01-15 MED ORDER — DIAZEPAM 5 MG PO TABS
10.0000 mg | ORAL_TABLET | ORAL | Status: AC
  Administered 2017-01-15: 10 mg via ORAL
  Filled 2017-01-15: qty 2

## 2017-01-15 MED ORDER — CIPROFLOXACIN HCL 500 MG PO TABS
500.0000 mg | ORAL_TABLET | ORAL | Status: AC
  Administered 2017-01-15: 500 mg via ORAL
  Filled 2017-01-15: qty 1

## 2017-01-15 MED ORDER — SODIUM CHLORIDE 0.9 % IV SOLN
INTRAVENOUS | Status: DC
  Administered 2017-01-15: 09:00:00 via INTRAVENOUS

## 2017-01-15 MED ORDER — DIPHENHYDRAMINE HCL 25 MG PO CAPS
25.0000 mg | ORAL_CAPSULE | ORAL | Status: AC
  Administered 2017-01-15: 25 mg via ORAL
  Filled 2017-01-15: qty 1

## 2017-01-15 NOTE — Op Note (Signed)
See [facility] OP note scanned into chart. Also because of the size, density, location and other factors that cannot be anticipated I feel this will likely be a staged procedure. This fact supersedes any indication in the scanned [facility] operative note to the contrary.  

## 2017-01-15 NOTE — H&P (Signed)
I have kidney stones.  HPI: Nadia Viar is a 52 year-old female patient who was referred by Cletis Athens, NP who is here for renal calculi.  The problem is on the right side. She first stated noticing pain on 10/15/2017. This is her first kidney stone. She is currently having flank pain. She denies having back pain, groin pain, nausea, vomiting, fever, and chills. She has not caught a stone in her urine strainer since her symptoms began.   She has never had surgical treatment for calculi in the past.   The patient had a CT scan on 10/15/2016 which revealed a 15 mm right UPJ calculus. There was also a small tiny left renal calculus. She has intermittent right flank pain. She denies having any other problems. She denies taking blood thinners.     ALLERGIES: Penicillin    MEDICATIONS: Raynelle Chary  Furosemide 20 mg tablet  Potassium Chloride 10 meq capsule, extended release  Ranitidine Hcl  Tylenol  Victoza 2-Pak  Xanax     GU PSH: None   NON-GU PSH: Breast Surgery Procedure, Right    GU PMH: None   NON-GU PMH: Acute gastric ulcer with hemorrhage Anxiety Arthritis Depression Diabetes Type 2 GERD Hypercholesterolemia Hypertension    FAMILY HISTORY: Death of family member - Mother stroke - Mother    Notes: *Pt adopted*   SOCIAL HISTORY: Marital Status: Legally Separated Preferred Language: English; Ethnicity: Not Hispanic Or Latino; Race: White Current Smoking Status: Patient does not smoke anymore. Has not smoked since 01/06/2013. Smoked for 23 years. Smoked 2 packs per day.   Tobacco Use Assessment Completed: Used Tobacco in last 30 days? Has never drank.  Drinks 3 caffeinated drinks per day. 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    REVIEW OF SYSTEMS:    GU Review Female:   Patient denies frequent urination, hard to postpone urination, burning /pain with urination, get up at night to urinate, leakage of urine, stream starts and stops,  trouble starting your stream, have to strain to urinate, and being pregnant.  Gastrointestinal (Upper):   Patient reports indigestion/ heartburn. Patient denies nausea and vomiting.  Gastrointestinal (Lower):   Patient denies diarrhea and constipation.  Constitutional:   Patient reports weight loss and fatigue. Patient denies fever and night sweats.  Skin:   Patient denies skin rash/ lesion and itching.  Eyes:   Patient denies blurred vision and double vision.  Ears/ Nose/ Throat:   Patient denies sinus problems and sore throat.  Hematologic/Lymphatic:   Patient denies swollen glands and easy bruising.  Cardiovascular:   Patient denies leg swelling and chest pains.  Respiratory:   Patient reports shortness of breath. Patient denies cough.  Endocrine:   Patient denies excessive thirst.  Musculoskeletal:   Patient reports back pain and joint pain.   Neurological:   Patient reports headaches and dizziness.   Psychologic:   Patient reports depression and anxiety.    VITAL SIGNS:      01/08/2017 01:39 PM  Weight 168 lb / 76.2 kg  Height 64 in / 162.56 cm  BP 114/69 mmHg  Pulse 60 /min  Temperature 98.4 F / 36.8 C  BMI 28.8 kg/m   MULTI-SYSTEM PHYSICAL EXAMINATION:    Constitutional: Well-nourished. No physical deformities. Normally developed. Good grooming.  Respiratory: No labored breathing, no use of accessory muscles.   Cardiovascular: Normal temperature, adequate peripheral perfusion of extremities  Skin: No paleness, no jaundice  Neurologic / Psychiatric: Oriented to time, oriented to  place, oriented to person. No depression, no anxiety, no agitation.  Gastrointestinal: No mass, no tenderness, no rigidity, non obese abdomen.  Eyes: Normal conjunctivae. Normal eyelids.  Musculoskeletal: Normal gait and station of head and neck.     PAST DATA REVIEWED:  Source Of History:  Patient  X-Ray Review: C.T. Abdomen/Pelvis: Reviewed Films. Reviewed Report. Discussed With Patient.      PROCEDURES:         KUB - [id number]  A single view of the abdomen is obtained.               Urinalysis w/Scope Dipstick Dipstick Cont'd Micro  Color: Yellow Bilirubin: Neg WBC/hpf: 6 - 10/hpf  Appearance: Clear Ketones: Neg RBC/hpf: NS (Not Seen)  Specific Gravity: 1.020 Blood: Neg Bacteria: Rare (0-9/hpf)  pH: 6.0 Protein: Neg Cystals: NS (Not Seen)  Glucose: Neg Urobilinogen: 0.2 Casts: NS (Not Seen)    Nitrites: Neg Trichomonas: Not Present    Leukocyte Esterase: Trace Mucous: Not Present      Epithelial Cells: 0 - 5/hpf      Yeast: NS (Not Seen)      Sperm: Not Present    ASSESSMENT:      ICD-10 Details  1 GU:   Renal calculus - N20.0    PLAN:           Orders Labs Urine Culture  X-Rays: KUB          Schedule         Document Letter(s):  Created for Patient: Clinical Summary         Notes:   We discussed the management of urinary stones. These options include observation, ureteroscopy, shockwave lithotripsy, and PCNL. We discussed which options are relevant to these particular stones. We discussed the natural history of stones as well as the complications of untreated stones and the impact on quality of life without treatment as well as with each of the above listed treatments. We also discussed the efficacy of each treatment in its ability to clear the stone burden. With any of these management options I discussed the signs and symptoms of infection and the need for emergent treatment should these be experienced. For each option we discussed the ability of each procedure to clear the patient of their stone burden.   For observation I described the risks which include but are not limited to silent renal damage, life-threatening infection, need for emergent surgery, failure to pass stone, and pain.   For ureteroscopy I described the risks which include heart attack, stroke, pulmonary embolus, death, bleeding, infection, damage to contiguous structures, positioning  injury, ureteral stricture, ureteral avulsion, ureteral injury, need for ureteral stent, inability to perform ureteroscopy, need for an interval procedure, inability to clear stone burden, stent discomfort and pain.   For shockwave lithotripsy I described the risks which include arrhythmia, kidney contusion, kidney hemorrhage, need for transfusion, pain, inability to break up stone, inability to pass stone fragments, Steinstrasse, infection associated with obstructing stones, need for different surgical procedure, need for repeat shockwave lithotripsy, and death.   For PCNL I described the risks including heart attack, pulmonary embolus, death, positioning injury, pneumothorax, hydrothorax, need for chest tube, inability to clear stone burden, renal laceration, arterial venous fistula or malformation, need for embolization of kidney, loss of kidney or renal function, need for repeat procedure, need for prolonged nephrostomy tube, ureteral avulsion.     Patient opted for ESWL.

## 2017-01-16 ENCOUNTER — Encounter (HOSPITAL_COMMUNITY): Payer: Self-pay | Admitting: Urology

## 2017-06-17 ENCOUNTER — Other Ambulatory Visit: Payer: Self-pay | Admitting: Internal Medicine

## 2017-06-17 DIAGNOSIS — E236 Other disorders of pituitary gland: Secondary | ICD-10-CM

## 2017-06-17 DIAGNOSIS — R946 Abnormal results of thyroid function studies: Secondary | ICD-10-CM

## 2017-06-17 DIAGNOSIS — L659 Nonscarring hair loss, unspecified: Secondary | ICD-10-CM

## 2017-07-17 ENCOUNTER — Encounter: Payer: Self-pay | Admitting: Obstetrics and Gynecology

## 2017-09-17 ENCOUNTER — Other Ambulatory Visit (HOSPITAL_COMMUNITY): Payer: Self-pay | Admitting: Nurse Practitioner

## 2017-09-17 DIAGNOSIS — R109 Unspecified abdominal pain: Secondary | ICD-10-CM

## 2017-09-17 DIAGNOSIS — R634 Abnormal weight loss: Secondary | ICD-10-CM

## 2017-09-29 ENCOUNTER — Other Ambulatory Visit: Payer: Self-pay | Admitting: Nurse Practitioner

## 2017-09-29 DIAGNOSIS — R634 Abnormal weight loss: Secondary | ICD-10-CM

## 2017-10-05 ENCOUNTER — Ambulatory Visit: Admission: RE | Admit: 2017-10-05 | Payer: BLUE CROSS/BLUE SHIELD | Source: Ambulatory Visit

## 2017-10-05 ENCOUNTER — Ambulatory Visit: Payer: BLUE CROSS/BLUE SHIELD | Attending: Nurse Practitioner

## 2017-12-01 ENCOUNTER — Other Ambulatory Visit: Payer: Self-pay | Admitting: Nurse Practitioner

## 2017-12-01 DIAGNOSIS — R635 Abnormal weight gain: Secondary | ICD-10-CM

## 2017-12-02 ENCOUNTER — Other Ambulatory Visit (HOSPITAL_COMMUNITY): Payer: Self-pay | Admitting: Nurse Practitioner

## 2017-12-02 DIAGNOSIS — R635 Abnormal weight gain: Secondary | ICD-10-CM

## 2017-12-02 DIAGNOSIS — E236 Other disorders of pituitary gland: Secondary | ICD-10-CM

## 2017-12-08 ENCOUNTER — Ambulatory Visit
Admission: RE | Admit: 2017-12-08 | Discharge: 2017-12-08 | Disposition: A | Discharge status: Home / Self Care | Payer: BLUE CROSS/BLUE SHIELD | Source: Ambulatory Visit | Attending: Nurse Practitioner | Admitting: Nurse Practitioner

## 2017-12-08 ENCOUNTER — Other Ambulatory Visit: Payer: Self-pay | Admitting: Nurse Practitioner

## 2017-12-08 DIAGNOSIS — R109 Unspecified abdominal pain: Secondary | ICD-10-CM | POA: Insufficient documentation

## 2017-12-08 DIAGNOSIS — R1900 Intra-abdominal and pelvic swelling, mass and lump, unspecified site: Secondary | ICD-10-CM | POA: Insufficient documentation

## 2017-12-08 DIAGNOSIS — R635 Abnormal weight gain: Secondary | ICD-10-CM | POA: Diagnosis not present

## 2017-12-08 DIAGNOSIS — E010 Iodine-deficiency related diffuse (endemic) goiter: Secondary | ICD-10-CM | POA: Diagnosis not present

## 2017-12-08 DIAGNOSIS — N200 Calculus of kidney: Secondary | ICD-10-CM | POA: Diagnosis not present

## 2017-12-23 ENCOUNTER — Ambulatory Visit: Payer: BLUE CROSS/BLUE SHIELD

## 2018-08-19 IMAGING — CR DG CHEST 2V
1 series · 2 of 2 positions shown · non-contrast
Comparison: Chest radiograph 02/13/2016

CLINICAL DATA: Right-sided chest pain

EXAM:
CHEST  2 VIEW

[Series 1: dg chest 2 view · 0.14mm/px · 2 of 2 slices shown]
[im 1/2]
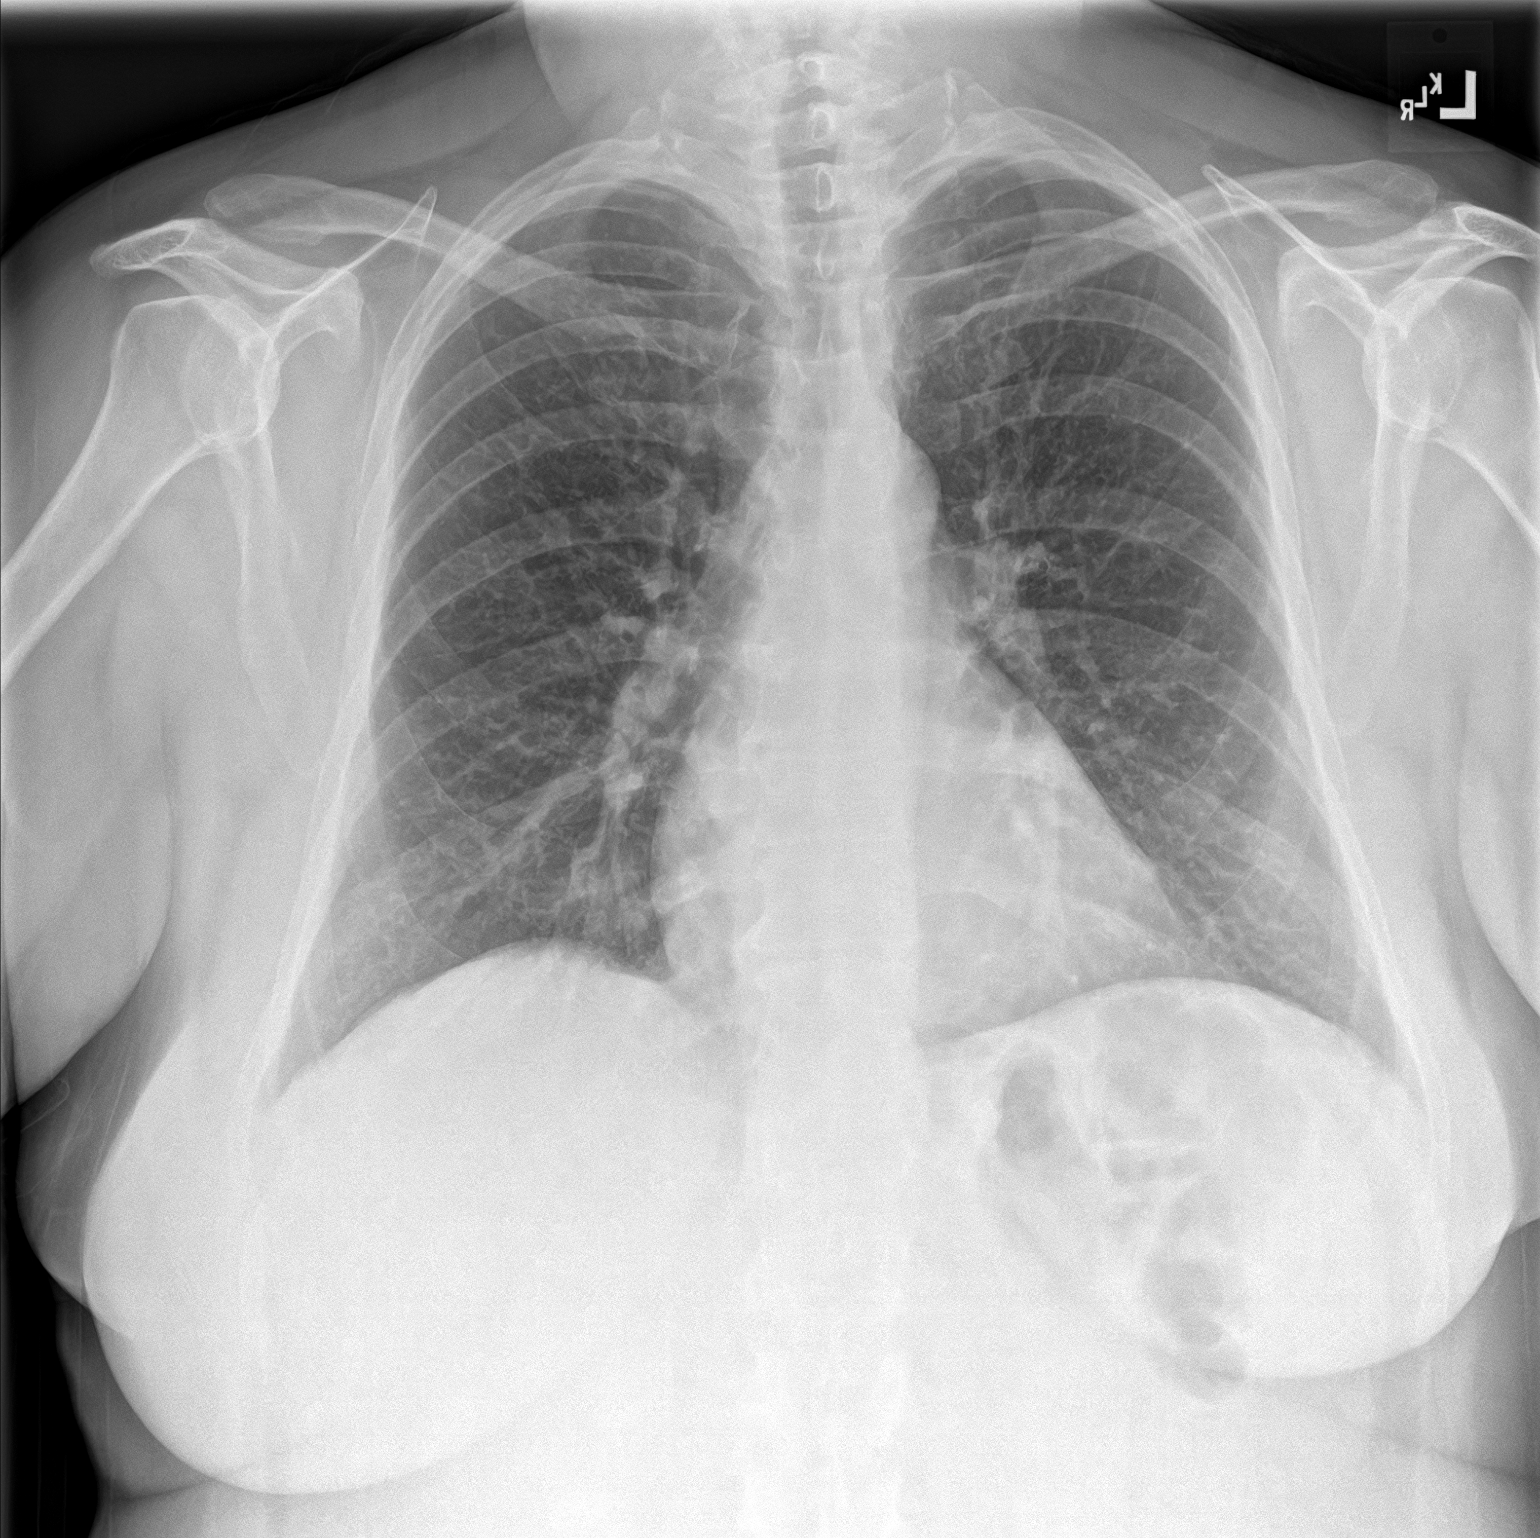
[im 2/2]
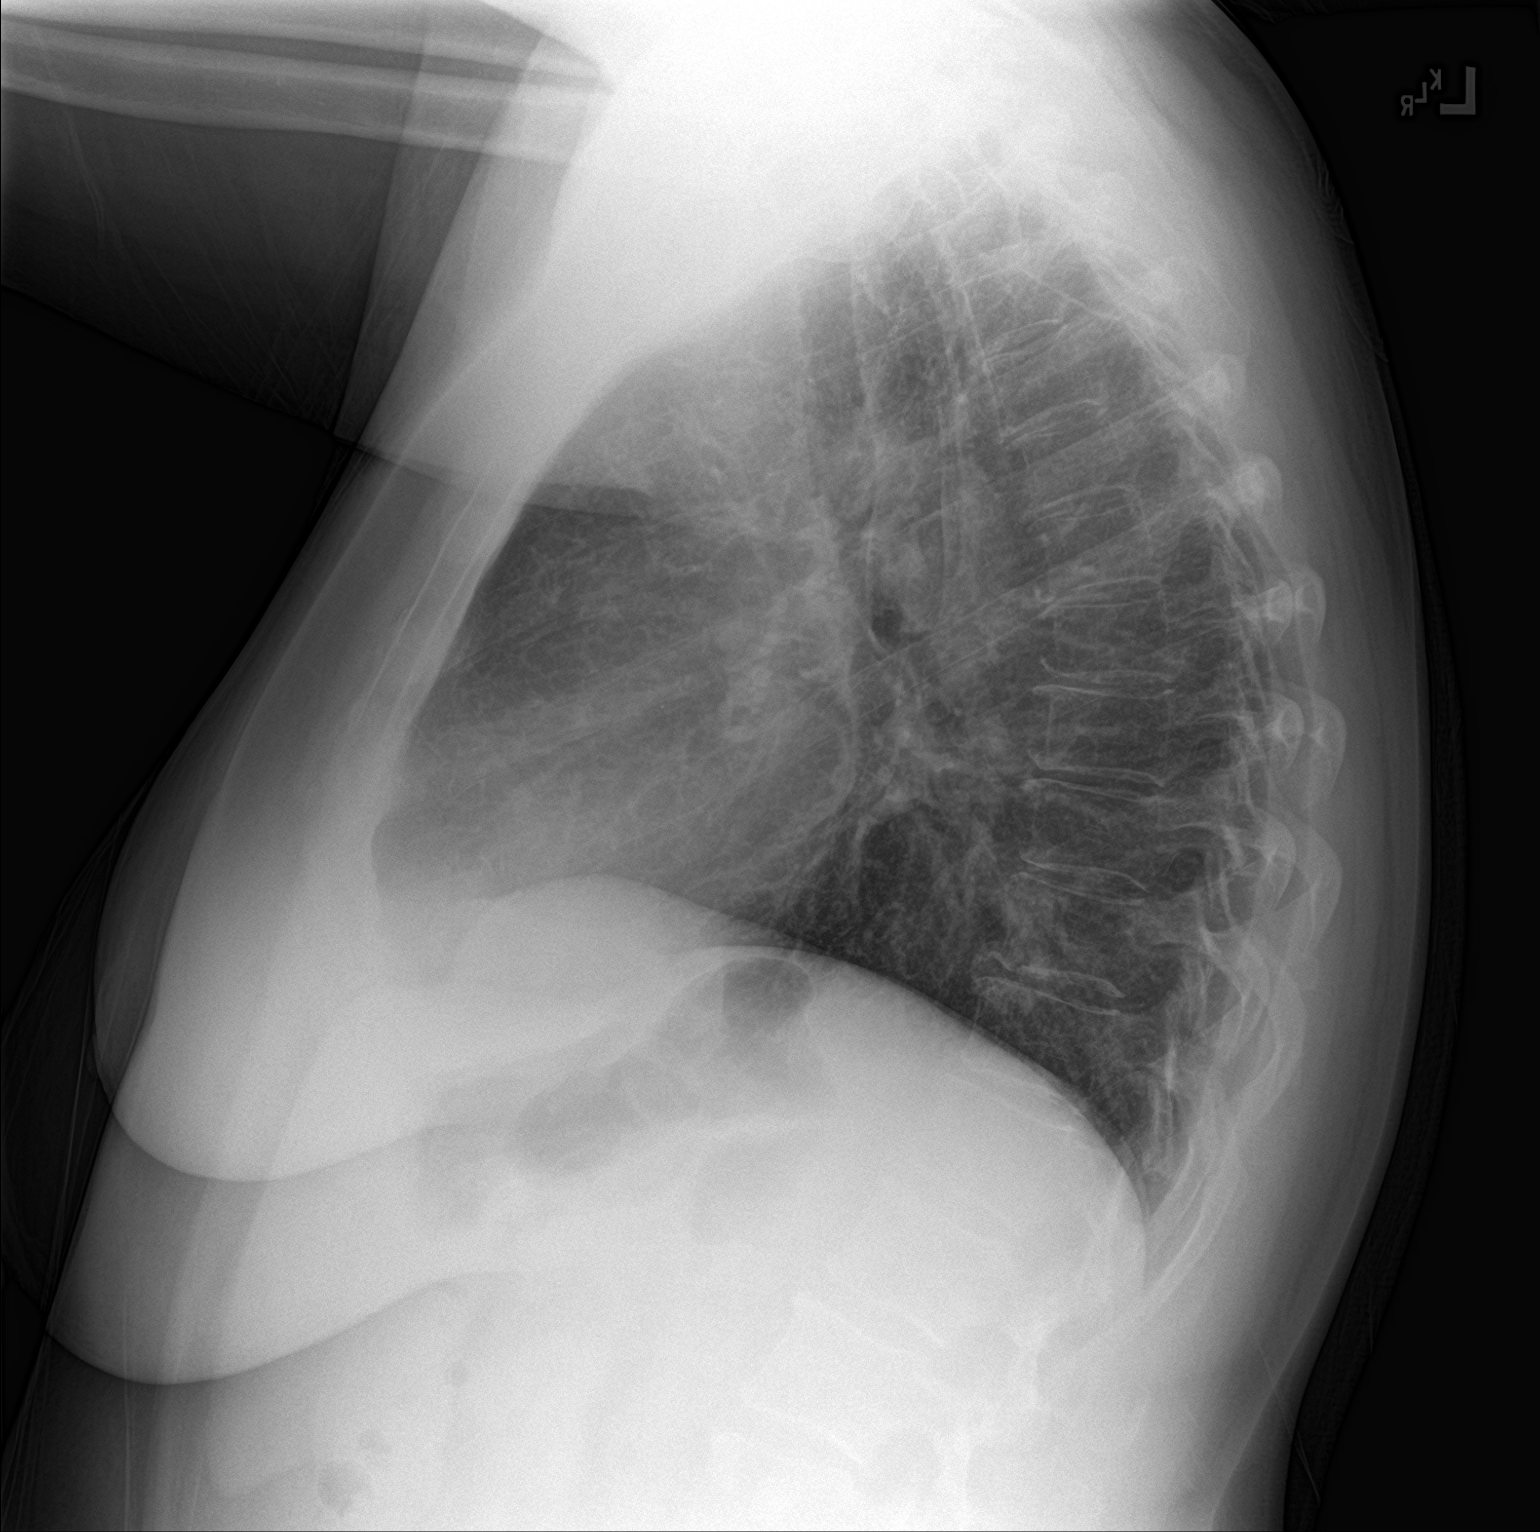

[2 of 2 positions shown; findings below may reference images not displayed]

FINDINGS: The heart size and mediastinal contours are within normal limits.
Both lungs are clear. The visualized skeletal structures are
unremarkable.
IMPRESSION: No active cardiopulmonary disease.

## 2018-10-21 DIAGNOSIS — N200 Calculus of kidney: Secondary | ICD-10-CM | POA: Insufficient documentation

## 2018-11-26 ENCOUNTER — Ambulatory Visit: Payer: BLUE CROSS/BLUE SHIELD | Admitting: Cardiology

## 2019-10-07 IMAGING — US US THYROID
1 series · 14 of 25 positions shown · non-contrast
Comparison: 01/14/2017

CLINICAL DATA: Abnormal weight gain

EXAM:
THYROID ULTRASOUND
TECHNIQUE: Ultrasound examination of the thyroid gland and adjacent soft
tissues was performed.

[Series 1: us thyroid · 14 of 63 slices shown]
[im 1/63]
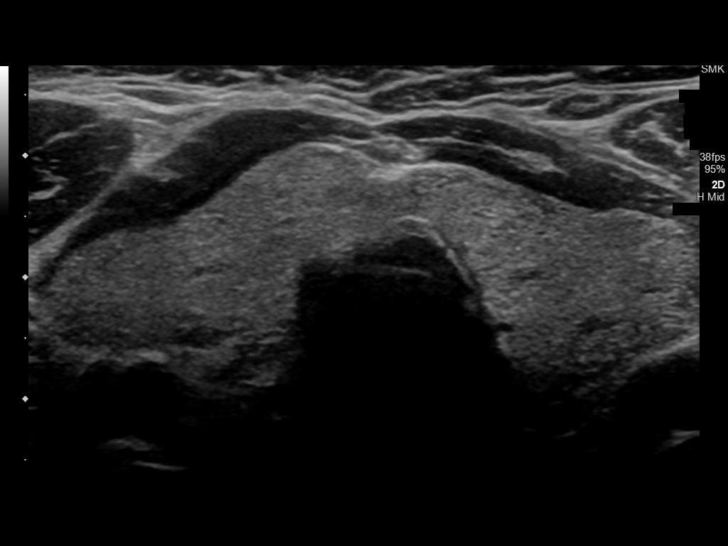
[im 6/63]
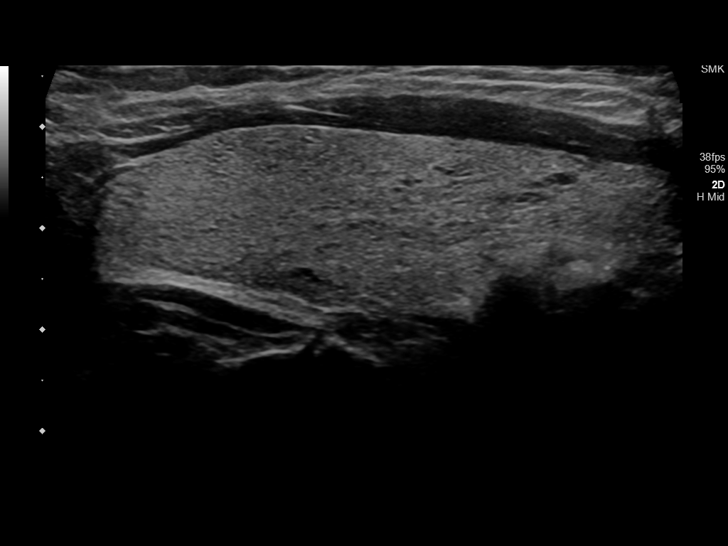
[im 11/63]
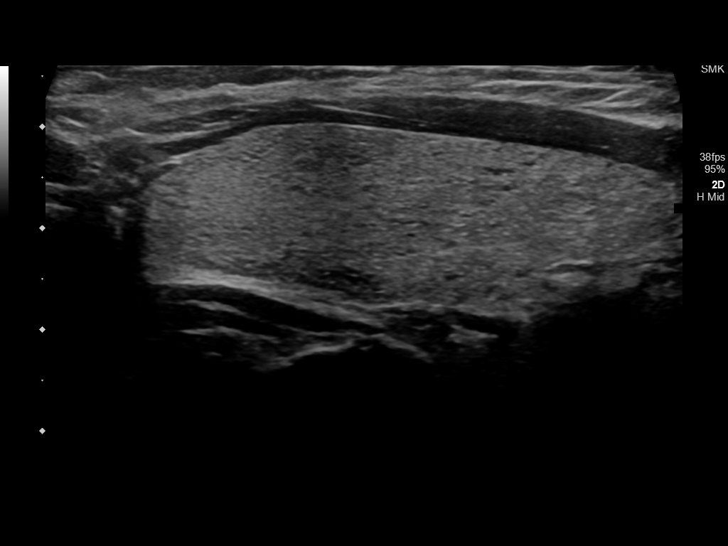
[im 16/63]
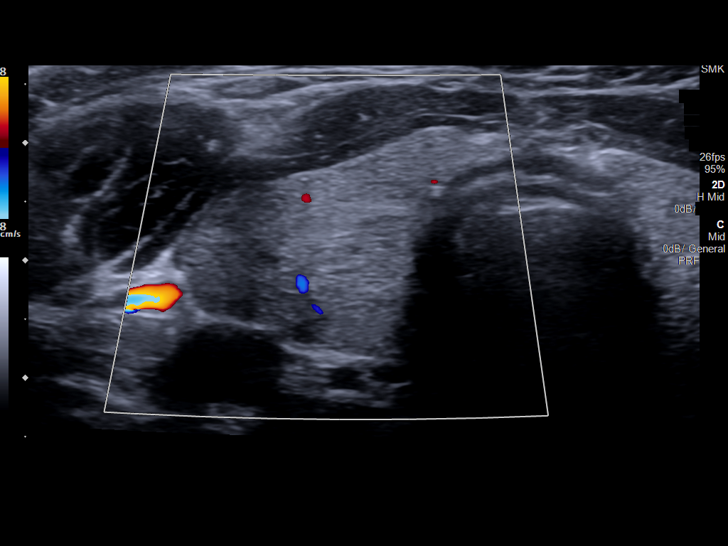
[im 21/63]
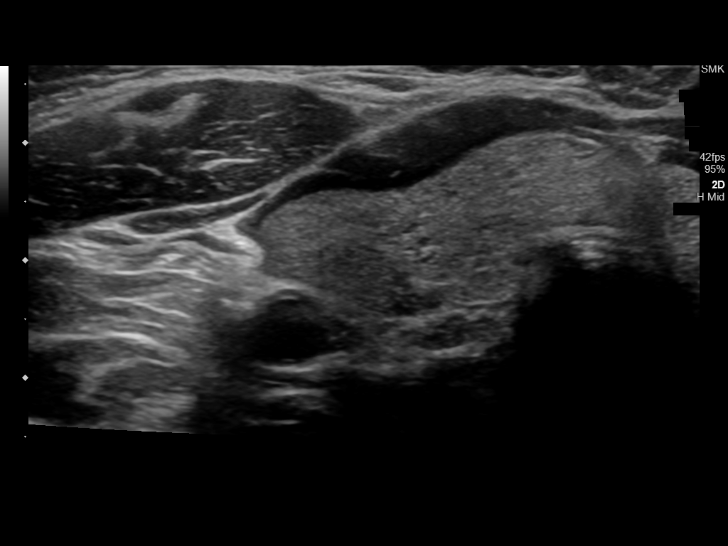
[im 24/63]
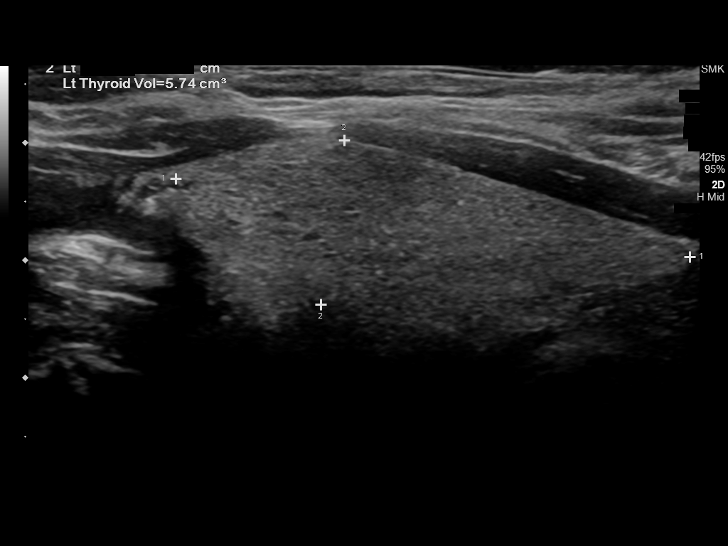
[im 29/63]
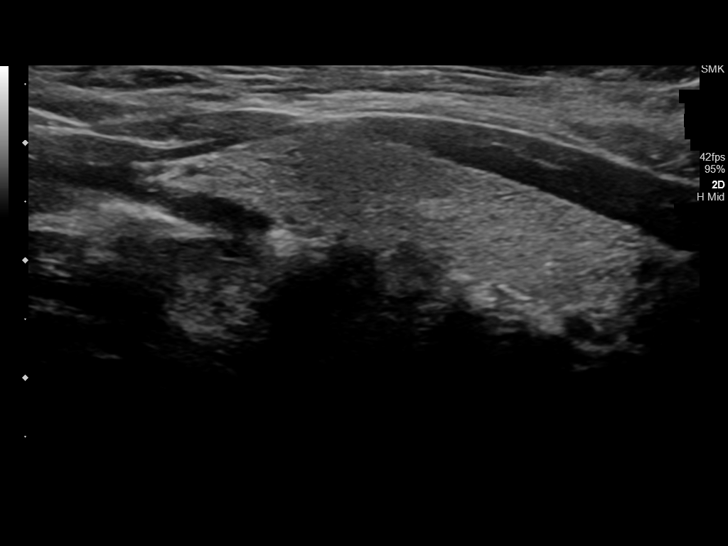
[im 34/63]
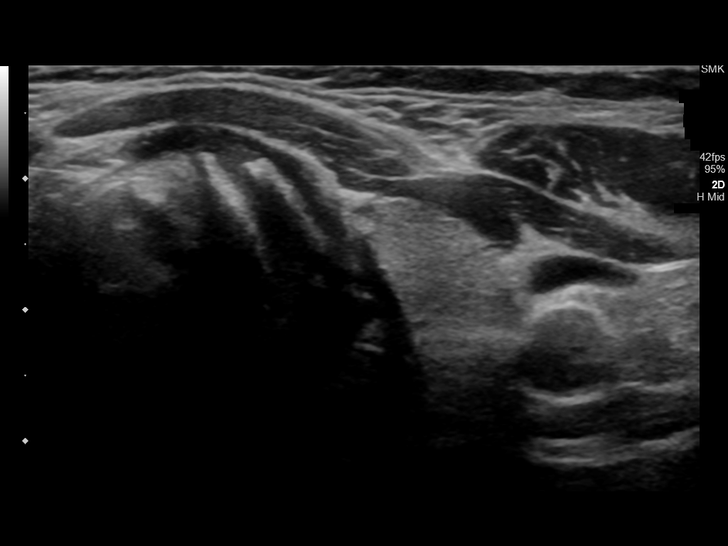
[im 39/63]
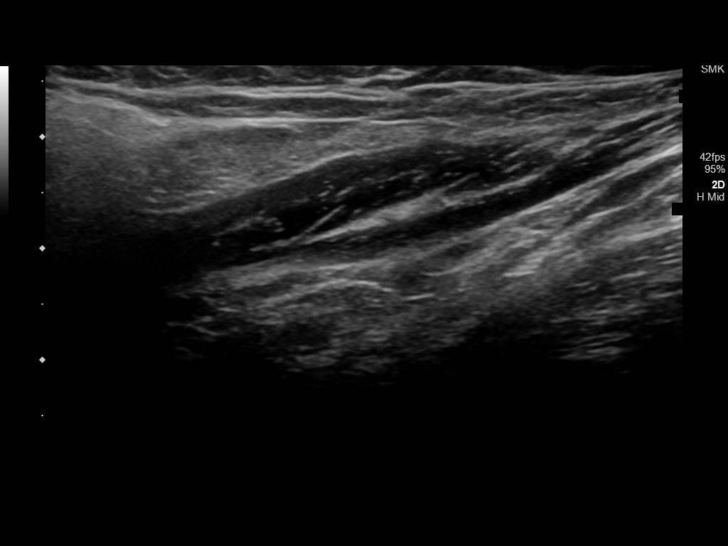
[im 42/63]
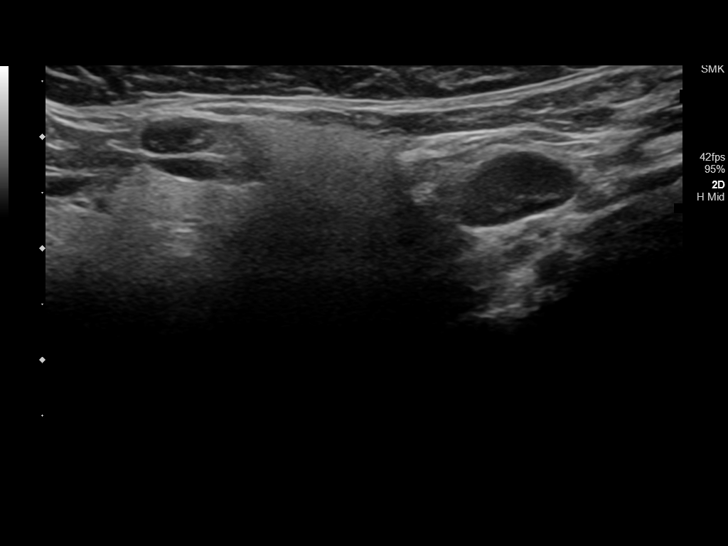
[im 47/63]
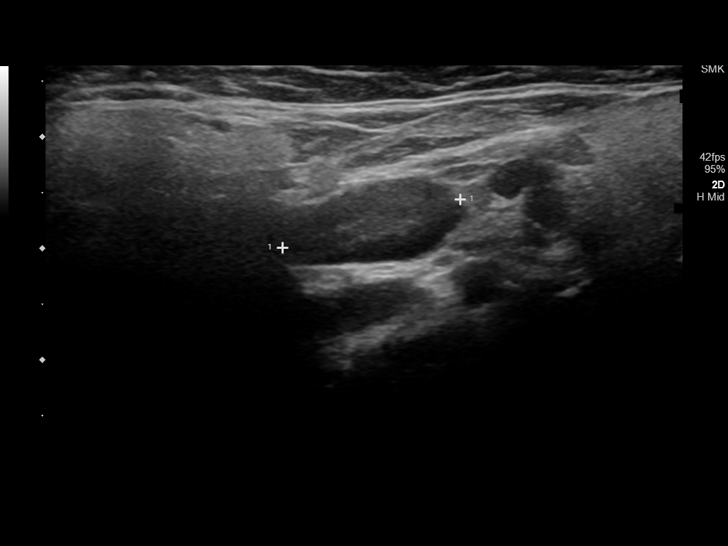
[im 52/63]
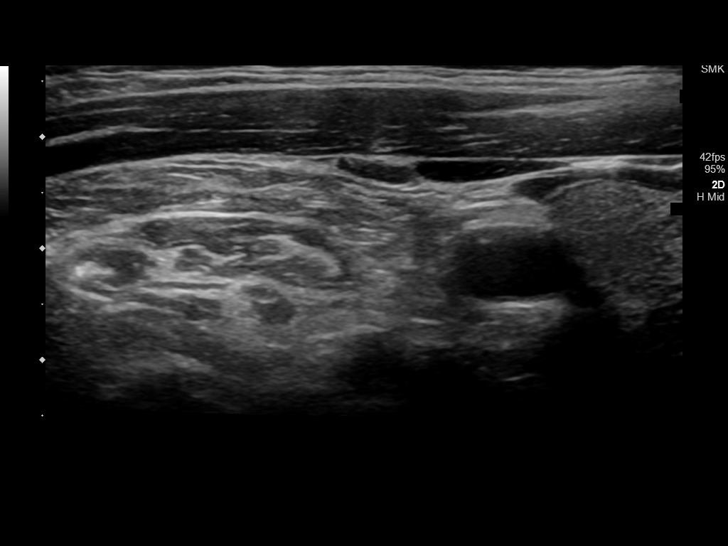
[im 57/63]
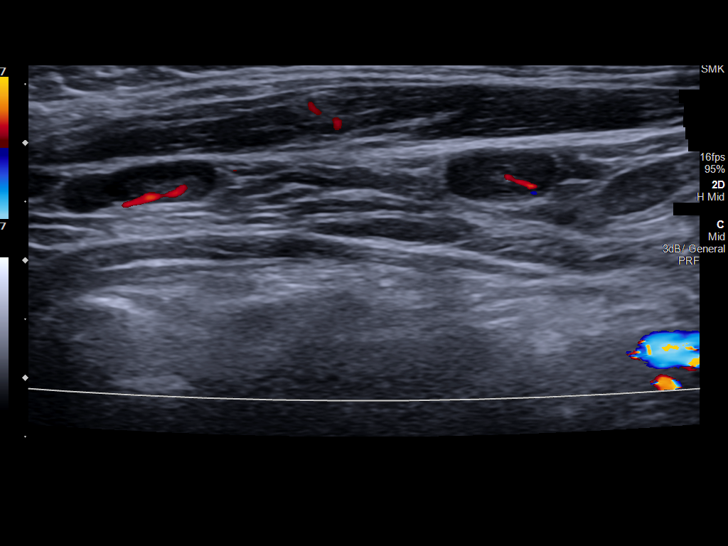
[im 63/63]
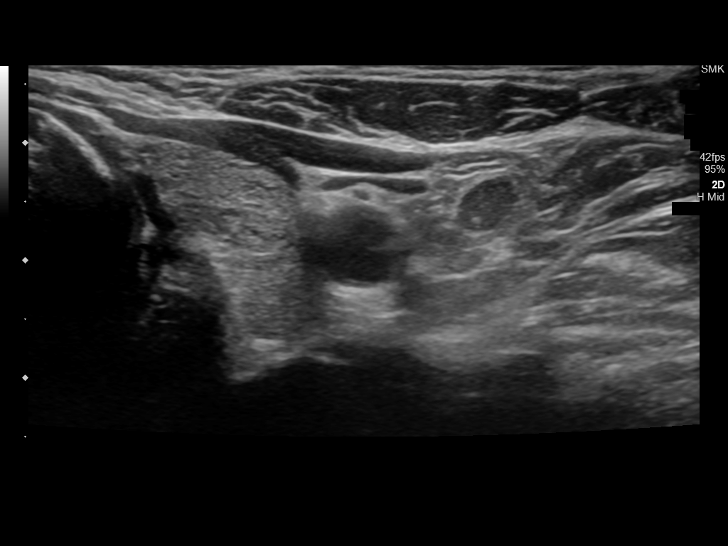

[14 of 25 positions shown; findings below may reference images not displayed]

FINDINGS: Parenchymal Echotexture: Mildly heterogenous

Isthmus: 0.7 cm thickness, previously

Right lobe: 5.7 x 1.8 x 2.2 cm, previously 5.5 x 1.8 x

Left lobe: 4.4 x 1.4 x 1.9 cm, previously 4.8 x 2.4 x

_________________________________________________________

Estimated total number of nodules >/= 1 cm: 0

Number of spongiform nodules >/=  2 cm not described below (TR1): 0

Number of mixed cystic and solid nodules >/= 1.5 cm not described
below (TR2): 0

_________________________________________________________

No discrete nodules are seen within the thyroid gland.
IMPRESSION: 1. Stable mild thyromegaly. No nodule or other indication for biopsy
or dedicated imaging follow-up.

The above is in keeping with the ACR TI-RADS recommendations - [HOSPITAL] 5066;[DATE].

## 2021-02-11 ENCOUNTER — Other Ambulatory Visit: Payer: Self-pay

## 2021-02-11 ENCOUNTER — Emergency Department
Admission: EM | Admit: 2021-02-11 | Discharge: 2021-02-11 | Disposition: A | Discharge status: Home / Self Care | Payer: BLUE CROSS/BLUE SHIELD | Attending: Emergency Medicine | Admitting: Emergency Medicine

## 2021-02-11 DIAGNOSIS — N390 Urinary tract infection, site not specified: Secondary | ICD-10-CM | POA: Insufficient documentation

## 2021-02-11 DIAGNOSIS — E119 Type 2 diabetes mellitus without complications: Secondary | ICD-10-CM | POA: Insufficient documentation

## 2021-02-11 DIAGNOSIS — I10 Essential (primary) hypertension: Secondary | ICD-10-CM

## 2021-02-11 LAB — CBC
HCT: 42.5 % (ref 36.0–46.0)
Hemoglobin: 13.9 g/dL (ref 12.0–15.0)
MCH: 31.1 pg (ref 26.0–34.0)
MCHC: 32.7 g/dL (ref 30.0–36.0)
MCV: 95.1 fL (ref 80.0–100.0)
Platelets: 183 10*3/uL (ref 150–400)
RBC: 4.47 MIL/uL (ref 3.87–5.11)
RDW: 13.2 % (ref 11.5–15.5)
WBC: 7.7 10*3/uL (ref 4.0–10.5)
nRBC: 0 % (ref 0.0–0.2)

## 2021-02-11 LAB — URINALYSIS, ROUTINE W REFLEX MICROSCOPIC
Bilirubin Urine: NEGATIVE
Glucose, UA: NEGATIVE mg/dL
Hgb urine dipstick: NEGATIVE
Ketones, ur: NEGATIVE mg/dL
Nitrite: POSITIVE — AB
Protein, ur: NEGATIVE mg/dL
Specific Gravity, Urine: 1.015 (ref 1.005–1.030)
pH: 5.5 (ref 5.0–8.0)

## 2021-02-11 LAB — BASIC METABOLIC PANEL
Anion gap: 6 (ref 5–15)
BUN: 12 mg/dL (ref 6–20)
CO2: 30 mmol/L (ref 22–32)
Calcium: 9.3 mg/dL (ref 8.9–10.3)
Chloride: 101 mmol/L (ref 98–111)
Creatinine, Ser: 0.61 mg/dL (ref 0.44–1.00)
GFR, Estimated: >60 mL/min (ref >60)
Glucose, Bld: 129 mg/dL — ABNORMAL HIGH (ref 70–99)
Potassium: 3.8 mmol/L (ref 3.5–5.1)
Sodium: 137 mmol/L (ref 135–145)

## 2021-02-11 MED ORDER — CEFDINIR 300 MG PO CAPS: 300.0000 mg | ORAL_CAPSULE | Freq: Two times a day (BID) | ORAL | 0 refills | Status: AC

## 2021-02-11 MED ORDER — NITROFURANTOIN MONOHYD MACRO 100 MG PO CAPS: 100.0000 mg | ORAL_CAPSULE | Freq: Two times a day (BID) | ORAL | 0 refills | Status: DC

## 2021-02-11 NOTE — ED Triage Notes (Signed)
Pt comes with c/o headache yesterday and dizziness today. Pt denies any N/V/D. Pt states BP has been elevated. Pt states they recently changed her BP med and she thinks it is not strong enough.

## 2021-02-11 NOTE — ED Notes (Signed)
Alert, NAD, calm, steady gait. Mentions renal hx. Also mentions BP med change recently. Went to [facility] this am d/t high BP, here for same. Mentions intermittent dizziness, none at this time.

## 2021-02-11 NOTE — ED Notes (Addendum)
Delay d/t Rx change x2, allergy to red #40. EDP aware. Encouraged to take tylenol for any resulting HA, and complete antibiotic. C&S explained. Pending culture.

## 2021-02-11 NOTE — ED Notes (Signed)
Delay d/t Rx change

## 2021-02-11 NOTE — ED Provider Notes (Signed)
[facility] Provider Note    Event Date/Time   First MD Initiated Contact with Patient 02/11/21 1342     (approximate)   History   Dizziness   HPI  Elizabeth Daniel is a 56 y.o. female with past medical history of hypertension diabetes anemia who presents with elevated blood pressure.  Patient had a mild headache this morning so she checked her blood pressure was [id number] systolic.  She called her primary doctor who said there was nothing they could do for her she should come to the emergency department.  She recently changed blood pressure medication because the medication she was taking before was not covered, she is currently taking olmesartan 20 mg she thinks is not as effective as her other medication.  Patient is frequently bending down for her work and has had some intermittent lightheadedness when she bends down and then comes up quickly.  Denies any ongoing dizziness or lightheadedness currently.  Headache is largely resolved without visual change numbness tingling or weakness.  Denies abdominal pain nausea or vomiting.  Patient tells me she has had frequent UTIs and has been on multiple antibiotics in the past.  Her doctors put her on an antibiotic that made her feel significantly dizzy.  Has had dysuria frequency and urgency.  Denies during dysuria but does have urinary frequency still.    Past Medical History:  Diagnosis Date   Anemia    Diabetes mellitus without complication (HCC)    Dyspnea    increased exertion    History of kidney stones    Hypertension    Panic attacks     There are no problems to display for this patient.    Physical Exam  Triage Vital Signs: ED Triage Vitals  Enc Vitals Group     BP 02/11/21 1239 (!) 135/111     Pulse Rate 02/11/21 1239 80     Resp 02/11/21 1239 16     Temp 02/11/21 1239 98.8 F (37.1 C)     Temp Source 02/11/21 1239 Oral     SpO2 02/11/21 1239 97 %     Weight --      Height --      Head  Circumference --      Peak Flow --      Pain Score 02/11/21 1232 7     Pain Loc --      Pain Edu? --      Excl. in [place]? --     Most recent vital signs: Vitals:   02/11/21 1415 02/11/21 1430  BP: 139/88 (!) 156/88  Pulse: 69 72  Resp:    Temp:    SpO2: 100% 100%     General: Awake, no distress.  CV:  Good peripheral perfusion.  Resp:  Normal effort.  Abd:  No distention.  Neuro:             Awake, Alert, Oriented x 3  Other:  Aox3, nml speech  PERRL, EOMI, face symmetric, nml tongue movement  5/5 strength in the BL upper and lower extremities  Sensation grossly intact in the BL upper and lower extremities  Finger-nose-finger intact BL    ED Results / Procedures / Treatments  Labs (all labs ordered are listed, but only abnormal results are displayed) Labs Reviewed  BASIC METABOLIC PANEL - Abnormal; Notable for the following components:      Result Value   Glucose, Bld 129 (*)    All other components within normal limits  URINALYSIS, ROUTINE W REFLEX MICROSCOPIC - Abnormal; Notable for the following components:   APPearance CLEAR (*)    Nitrite POSITIVE (*)    Leukocytes,Ua MODERATE (*)    Bacteria, UA MANY (*)    All other components within normal limits  URINE CULTURE  CBC     EKG  EKG interpretation performed by myself: NSR, nml axis, nml intervals, no acute ischemic changes    RADIOLOGY    PROCEDURES:  Critical Care performed: No  .1-3 Lead EKG Interpretation Performed by: Rada Hay, MD Authorized by: Rada Hay, MD     Interpretation: normal     ECG rate assessment: normal     Rhythm: sinus rhythm     Ectopy: none     Conduction: normal    The patient is on the cardiac monitor to evaluate for evidence of arrhythmia and/or significant heart rate changes.   MEDICATIONS ORDERED IN ED: Medications - No data to display   IMPRESSION / MDM / [place] / ED COURSE  I reviewed the triage vital signs and the nursing  notes.                              Differential diagnosis includes, but is not limited to, hypertensive emergency, asymptomatic hypertension, vertigo, vasovagal, orthostatic  Is a 56 year old female primarily presenting due to concern for elevated blood pressure.  She had a mild headache this morning and has had some dizziness when she bends down and then stands up so she checked her blood pressure and it was elevated in the 220s.  Her blood pressure medication was recently changed to olmesartan because of insurance reasons.  She does not check her blood pressure daily only when she is not feeling well.  Really she has no ongoing symptoms including no significant headache visual change dizziness chest pain or shortness of breath.  Patient's blood pressure is 156/88.  She appears well with normal neurologic exam.  CBC and CMP reviewed which are within normal limits.  EKG is also normal.  I suspect that she has asymptomatic hypertension.  UA was sent from triage which does have significant WBCs and nitrate positive.  Patient tells me she has been on multiple antibiotics for fairly chronic UTI.  Have some urinary frequency but no dysuria, no recent fevers or flank pain.  Will write for 5-day course of cefdinir.  Urine culture sent.  Patient advised to check her blood pressure once daily and follow-up with her primary care provider for ongoing management of her hypertension.      FINAL CLINICAL IMPRESSION(S) / ED DIAGNOSES   Final diagnoses:  Hypertension, unspecified type  Urinary tract infection without hematuria, site unspecified     Rx / DC Orders   ED Discharge Orders          Ordered    nitrofurantoin, macrocrystal-monohydrate, (MACROBID) 100 MG capsule  2 times daily,   Status:  Discontinued        02/11/21 1417    cefdinir (OMNICEF) 300 MG capsule  2 times daily        02/11/21 1429             Note:  This document was prepared using Dragon voice recognition software and may  include unintentional dictation errors.   Rada Hay, MD 02/11/21 [phone]

## 2021-02-14 LAB — URINE CULTURE: Culture: >=100000 — AB

## 2021-11-19 ENCOUNTER — Ambulatory Visit
Admission: RE | Admit: 2021-11-19 | Discharge: 2021-11-19 | Disposition: A | Discharge status: Home / Self Care | Payer: Commercial Managed Care - HMO | Source: Ambulatory Visit | Attending: Urology | Admitting: Urology

## 2021-11-19 ENCOUNTER — Encounter: Payer: Self-pay | Admitting: Urology

## 2021-11-19 ENCOUNTER — Other Ambulatory Visit: Payer: Self-pay | Admitting: *Deleted

## 2021-11-19 ENCOUNTER — Ambulatory Visit: Payer: Commercial Managed Care - HMO | Admitting: Urology

## 2021-11-19 ENCOUNTER — Ambulatory Visit
Admission: RE | Admit: 2021-11-19 | Discharge: 2021-11-19 | Disposition: A | Discharge status: Home / Self Care | Payer: Commercial Managed Care - HMO | Source: Home / Self Care | Attending: Urology | Admitting: Urology

## 2021-11-19 ENCOUNTER — Ambulatory Visit
Admission: RE | Admit: 2021-11-19 | Discharge: 2021-11-19 | Disposition: A | Discharge status: Home / Self Care | Payer: Commercial Managed Care - HMO | Attending: Urology | Admitting: Urology

## 2021-11-19 VITALS — BP 167/89 | HR 83 | Ht 64.5 in | Wt 215.0 lb

## 2021-11-19 DIAGNOSIS — Z87440 Personal history of urinary (tract) infections: Secondary | ICD-10-CM

## 2021-11-19 DIAGNOSIS — N200 Calculus of kidney: Secondary | ICD-10-CM

## 2021-11-19 DIAGNOSIS — Z87442 Personal history of urinary calculi: Secondary | ICD-10-CM

## 2021-11-19 DIAGNOSIS — R109 Unspecified abdominal pain: Secondary | ICD-10-CM | POA: Diagnosis not present

## 2021-11-19 DIAGNOSIS — R82998 Other abnormal findings in urine: Secondary | ICD-10-CM

## 2021-11-19 DIAGNOSIS — N390 Urinary tract infection, site not specified: Secondary | ICD-10-CM

## 2021-11-19 LAB — URINALYSIS, COMPLETE (UACMP) WITH MICROSCOPIC
Bilirubin Urine: NEGATIVE
Glucose, UA: NEGATIVE mg/dL
Hgb urine dipstick: NEGATIVE
Ketones, ur: NEGATIVE mg/dL
Nitrite: POSITIVE — AB
Protein, ur: NEGATIVE mg/dL
RBC / HPF: NONE SEEN RBC/hpf (ref 0–5)
Specific Gravity, Urine: 1.02 (ref 1.005–1.030)
pH: 5.5 (ref 5.0–8.0)

## 2021-11-19 MED ORDER — SULFAMETHOXAZOLE-TRIMETHOPRIM 800-160 MG PO TABS: 1.0000 | ORAL_TABLET | Freq: Two times a day (BID) | ORAL | 0 refills | Status: AC

## 2021-11-19 NOTE — Progress Notes (Signed)
11/19/21 1:19 PM   Baltazar Najjar 08/24/1965 [id number]  CC: Nephrolithiasis, recurrent UTI  HPI: 56 year old female with history nephrolithiasis, including right-sided shockwave lithotripsy with Dr. Marlou Porch in [place] in January 2019.  She is referred for follow-up of kidney stones, as well as for " history of kidney cancer."  I have performed extensive chart review and I do not see any mention of kidney cancer in any records or imaging.  When I asked her about this she said " she had the records sealed by an attorney because she did not want her dad to know she had cancer."  When I asked for further information she stated it was very expensive to have the records sealed and she could not give me any more information.  She would not tell me where this was done or who her urologist was. She reports she had chemotherapy and radiation.   She reports intermittent bilateral flank pain as well as recurrent UTIs.  She reports she can have fever up to 104 degrees, but this has not happened for at least a few months.  She denies any gross hematuria.  She denies any dysuria or urgency/frequency today.  Urinalysis today appears infected with 0-5 squamous cells, 11-20 WBCs, 0 RBC, many bacteria, nitrite positive, small leukocytes.  Will send for culture.   PMH: Past Medical History:  Diagnosis Date   Anemia    Diabetes mellitus without complication (HCC)    Dyspnea    increased exertion    History of kidney stones    Hypertension    Panic attacks     Surgical History: Past Surgical History:  Procedure Laterality Date   BREAST SURGERY     exploratory surgery secondary to recurring infections   EXTRACORPOREAL SHOCK WAVE LITHOTRIPSY Right 01/15/2017   Procedure: RIGHT EXTRACORPOREAL SHOCK WAVE LITHOTRIPSY (ESWL);  Surgeon: Crist Fat, MD;  Location: [facility];  Service: Urology;  Laterality: Right;   WISDOM TOOTH EXTRACTION      Social History:  reports that she quit smoking about  7 years ago. Her smoking use included cigarettes. She has been exposed to tobacco smoke. She has never used smokeless tobacco. She reports that she does not drink alcohol and does not use drugs.  Physical Exam: BP (!) 167/89   Pulse 83   Ht 5' 4.5" (1.638 m)   Wt 215 lb (97.5 kg)   BMI 36.33 kg/m    Constitutional:  Alert and oriented, No acute distress. Cardiovascular: No clubbing, cyanosis, or edema. Respiratory: Normal respiratory effort, no increased work of breathing. GI: Abdomen is soft, nontender, nondistended, no abdominal masses   Laboratory Data: Reviewed, see HPI  Pertinent Imaging: I have personally viewed and interpreted the KUB today showing an 8 mm right lower pole stone, no evidence of ureteral stones.  CT abdomen and pelvis from 2018 and 2020 reports reviewed, no mention of any renal mass or malignancy.  Assessment & Plan:   56 year old female with history of nephrolithiasis as well as reported recurrent UTI.  She also reports a history of kidney cancer, however there is no record of this anywhere in the notes or on prior imaging, and I do not think she ever had a diagnosis of kidney cancer.  She does have an extensive psychiatric history as well, and I think the story of kidney cancer is fabricated.  She does have a known history of nephrolithiasis and underwent a shockwave lithotripsy in 2019 with Dr. Marlou Porch.  No evidence of ureteral stones  on KUB today, but there is a stable 8 mm right lower pole stone.  I offered CT for more definitive imaging but she deferred.  We discussed general stone prevention strategies including adequate hydration with goal of producing 2.5 L of urine daily, increasing citric acid intake, increasing calcium intake during high oxalate meals, minimizing animal protein, and decreasing salt intake. Information about dietary recommendations given today.   Urinalysis today does appear suspicious for infection.  Will send for culture.  I recommended  starting Bactrim DS twice daily and will call with culture results.  Regarding UTI prevention, I recommended starting with cranberry tablets twice daily.  Bactrim DS twice daily for suspected UTI, follow-up culture RTC 6 months KUB for right lower pole stone surveillance   Legrand Rams, MD 11/19/2021  [facility] [address], [address] [place], [place] [address] [phone]

## 2021-11-19 NOTE — Patient Instructions (Signed)

## 2021-11-22 LAB — URINE CULTURE: Culture: >=100000 — AB

## 2021-12-24 ENCOUNTER — Ambulatory Visit (INDEPENDENT_AMBULATORY_CARE_PROVIDER_SITE_OTHER): Payer: Commercial Managed Care - HMO | Admitting: Psychiatry

## 2021-12-24 ENCOUNTER — Encounter: Payer: Self-pay | Admitting: Psychiatry

## 2021-12-24 VITALS — BP 188/111 | HR 92

## 2021-12-24 DIAGNOSIS — F4310 Post-traumatic stress disorder, unspecified: Secondary | ICD-10-CM | POA: Diagnosis not present

## 2021-12-24 DIAGNOSIS — Z79899 Other long term (current) drug therapy: Secondary | ICD-10-CM | POA: Diagnosis not present

## 2021-12-24 DIAGNOSIS — F331 Major depressive disorder, recurrent, moderate: Secondary | ICD-10-CM | POA: Insufficient documentation

## 2021-12-24 DIAGNOSIS — F1211 Cannabis abuse, in remission: Secondary | ICD-10-CM

## 2021-12-24 MED ORDER — HYDROXYZINE HCL 25 MG PO TABS: 12.5000 mg | ORAL_TABLET | Freq: Three times a day (TID) | ORAL | 1 refills | Status: DC | PRN

## 2021-12-24 MED ORDER — ALPRAZOLAM 1 MG PO TABS: 0.5000 mg | ORAL_TABLET | ORAL | Status: DC

## 2021-12-24 NOTE — Patient Instructions (Signed)
Hydroxyzine Capsules or Tablets What is this medication? HYDROXYZINE (hye DROX i zeen) treats the symptoms of allergies and allergic reactions. It may also be used to treat anxiety or cause drowsiness before a procedure. It works by blocking histamine, a substance released by the body during an allergic reaction. It belongs to a group of medications called antihistamines. This medicine may be used for other purposes; ask your health care provider or pharmacist if you have questions. COMMON BRAND NAME(S): ANX, Atarax, Rezine, Vistaril What should I tell my care team before I take this medication? They need to know if you have any of these conditions: Glaucoma Heart disease Irregular heartbeat or rhythm Kidney disease Liver disease Lung or breathing disease, such as asthma Stomach or intestine problems Thyroid disease Trouble passing urine An unusual or allergic reaction to hydroxyzine, other medications, foods, dyes or preservatives Pregnant or trying to get pregnant Breastfeeding How should I use this medication? Take this medication by mouth with a full glass of water. Take it as directed on the prescription label at the same time every day. You can take it with or without food. If it upsets your stomach, take it with food. Talk to your care team about the use of this medication in children. While it may be prescribed for children as young as 6 years for selected conditions, precautions do apply. People 65 years and older may have a stronger reaction and need a smaller dose. Overdosage: If you think you have taken too much of this medicine contact a poison control center or emergency room at once. NOTE: This medicine is only for you. Do not share this medicine with others. What if I miss a dose? If you miss a dose, take it as soon as you can. If it is almost time for your next dose, take only that dose. Do not take double or extra doses. What may interact with this medication? Do not  take this medication with any of the following: Cisapride Dronedarone Pimozide Thioridazine This medication may also interact with the following: Alcohol Antihistamines for allergy, cough, and cold Atropine Barbiturate medications for sleep or seizures, such as phenobarbital Certain antibiotics, such as erythromycin or clarithromycin Certain medications for anxiety or sleep Certain medications for bladder problems, such as oxybutynin or tolterodine Certain medications for irregular heartbeat Certain medications for mental health conditions Certain medications for Parkinson disease, such as benztropine, trihexyphenidyl Certain medications for seizures, such as phenobarbital or primidone Certain medications for stomach problems, such as dicyclomine or hyoscyamine Certain medications for travel sickness, such as scopolamine Ipratropium Opioid medications for pain Other medications that cause heart rhythm changes, such as dofetilide This list may not describe all possible interactions. Give your health care provider a list of all the medicines, herbs, non-prescription drugs, or dietary supplements you use. Also tell them if you smoke, drink alcohol, or use illegal drugs. Some items may interact with your medicine. What should I watch for while using this medication? Visit your care team for regular checks on your progress. Tell your care team if your symptoms do not start to get better or if they get worse. This medication may affect your coordination, reaction time, or judgment. Do not drive or operate machinery until you know how this medication affects you. Sit up or stand slowly to reduce the risk of dizzy or fainting spells. Drinking alcohol with this medication can increase the risk of these side effects. Your mouth may get dry. Chewing sugarless gum or sucking hard candy   and drinking plenty of water may help. Contact your care team if the problem does not go away or is severe. This  medication may cause dry eyes and blurred vision. If you wear contact lenses, you may feel some discomfort. Lubricating eye drops may help. See your care team if the problem does not go away or is severe. If you are receiving skin tests for allergies, tell your care team you are taking this medication. What side effects may I notice from receiving this medication? Side effects that you should report to your care team as soon as possible: Allergic reactions--skin rash, itching, hives, swelling of the face, lips, tongue, or throat Heart rhythm changes--fast or irregular heartbeat, dizziness, feeling faint or lightheaded, chest pain, trouble breathing Side effects that usually do not require medical attention (report to your care team if they continue or are bothersome): Confusion Drowsiness Dry mouth Hallucinations Headache This list may not describe all possible side effects. Call your doctor for medical advice about side effects. You may report side effects to FDA at 1-800-FDA-1088. Where should I keep my medication? Keep out of the reach of children and pets. Store at room temperature between 15 and 30 degrees C (59 and 86 degrees F). Keep container tightly closed. Throw away any unused medication after the expiration date. NOTE: This sheet is a summary. It may not cover all possible information. If you have questions about this medicine, talk to your doctor, pharmacist, or health care provider.  2023 Elsevier/Gold Standard (2007-02-13 00:00:00)  
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
## 2022-02-05 DIAGNOSIS — I10 Essential (primary) hypertension: Secondary | ICD-10-CM | POA: Diagnosis not present

## 2022-02-05 DIAGNOSIS — R69 Illness, unspecified: Secondary | ICD-10-CM | POA: Diagnosis not present

## 2022-02-05 DIAGNOSIS — B370 Candidal stomatitis: Secondary | ICD-10-CM | POA: Diagnosis not present

## 2022-02-05 DIAGNOSIS — E119 Type 2 diabetes mellitus without complications: Secondary | ICD-10-CM | POA: Diagnosis not present

## 2022-02-05 DIAGNOSIS — D519 Vitamin B12 deficiency anemia, unspecified: Secondary | ICD-10-CM | POA: Diagnosis not present

## 2022-02-05 DIAGNOSIS — E559 Vitamin D deficiency, unspecified: Secondary | ICD-10-CM | POA: Diagnosis not present

## 2022-02-05 DIAGNOSIS — E785 Hyperlipidemia, unspecified: Secondary | ICD-10-CM | POA: Diagnosis not present

## 2022-02-07 ENCOUNTER — Ambulatory Visit: Payer: Commercial Managed Care - HMO | Admitting: Psychiatry

## 2022-02-10 DIAGNOSIS — I10 Essential (primary) hypertension: Secondary | ICD-10-CM | POA: Diagnosis not present

## 2022-04-30 DIAGNOSIS — K30 Functional dyspepsia: Secondary | ICD-10-CM | POA: Diagnosis not present

## 2022-04-30 DIAGNOSIS — N952 Postmenopausal atrophic vaginitis: Secondary | ICD-10-CM | POA: Diagnosis not present

## 2022-04-30 DIAGNOSIS — Z7251 High risk heterosexual behavior: Secondary | ICD-10-CM | POA: Diagnosis not present

## 2022-04-30 DIAGNOSIS — B9689 Other specified bacterial agents as the cause of diseases classified elsewhere: Secondary | ICD-10-CM | POA: Diagnosis not present

## 2022-04-30 DIAGNOSIS — B3731 Acute candidiasis of vulva and vagina: Secondary | ICD-10-CM | POA: Diagnosis not present

## 2022-04-30 DIAGNOSIS — N760 Acute vaginitis: Secondary | ICD-10-CM | POA: Diagnosis not present

## 2022-05-07 DIAGNOSIS — R451 Restlessness and agitation: Secondary | ICD-10-CM | POA: Diagnosis not present

## 2022-05-07 DIAGNOSIS — E785 Hyperlipidemia, unspecified: Secondary | ICD-10-CM | POA: Diagnosis not present

## 2022-05-07 DIAGNOSIS — E559 Vitamin D deficiency, unspecified: Secondary | ICD-10-CM | POA: Diagnosis not present

## 2022-05-07 DIAGNOSIS — Z79899 Other long term (current) drug therapy: Secondary | ICD-10-CM | POA: Diagnosis not present

## 2022-05-07 DIAGNOSIS — E119 Type 2 diabetes mellitus without complications: Secondary | ICD-10-CM | POA: Diagnosis not present

## 2022-05-07 DIAGNOSIS — F419 Anxiety disorder, unspecified: Secondary | ICD-10-CM | POA: Diagnosis not present

## 2022-05-20 ENCOUNTER — Ambulatory Visit: Payer: Commercial Managed Care - HMO | Admitting: Urology

## 2022-07-04 ENCOUNTER — Telehealth: Payer: Self-pay

## 2022-07-04 NOTE — Patient Outreach (Signed)
  Care Coordination   07/04/2022 Name: Elizabeth Daniel MRN: [id number] DOB: 05-06-1965   Care Coordination Outreach Attempts:  An unsuccessful telephone outreach was attempted today to offer the patient information about available care coordination services.  Follow Up Plan:  Additional outreach attempts will be made to offer the patient care coordination information and services.   Encounter Outcome:  No Answer   Care Coordination Interventions:  No, not indicated    Rowe Pavy, RN, BSN, [facility] [employer] [phone]

## 2022-11-10 DIAGNOSIS — E559 Vitamin D deficiency, unspecified: Secondary | ICD-10-CM | POA: Diagnosis not present

## 2022-11-10 DIAGNOSIS — E785 Hyperlipidemia, unspecified: Secondary | ICD-10-CM | POA: Diagnosis not present

## 2022-11-10 DIAGNOSIS — E119 Type 2 diabetes mellitus without complications: Secondary | ICD-10-CM | POA: Diagnosis not present

## 2022-11-10 DIAGNOSIS — D519 Vitamin B12 deficiency anemia, unspecified: Secondary | ICD-10-CM | POA: Diagnosis not present

## 2023-03-02 DIAGNOSIS — Z79899 Other long term (current) drug therapy: Secondary | ICD-10-CM | POA: Diagnosis not present

## 2023-03-02 DIAGNOSIS — R1013 Epigastric pain: Secondary | ICD-10-CM | POA: Diagnosis not present

## 2023-03-02 DIAGNOSIS — Z1331 Encounter for screening for depression: Secondary | ICD-10-CM | POA: Diagnosis not present

## 2023-03-02 DIAGNOSIS — R197 Diarrhea, unspecified: Secondary | ICD-10-CM | POA: Diagnosis not present

## 2023-03-02 DIAGNOSIS — E119 Type 2 diabetes mellitus without complications: Secondary | ICD-10-CM | POA: Diagnosis not present

## 2023-03-02 DIAGNOSIS — R451 Restlessness and agitation: Secondary | ICD-10-CM | POA: Diagnosis not present

## 2023-03-02 DIAGNOSIS — F419 Anxiety disorder, unspecified: Secondary | ICD-10-CM | POA: Diagnosis not present

## 2023-03-05 DIAGNOSIS — R197 Diarrhea, unspecified: Secondary | ICD-10-CM | POA: Diagnosis not present

## 2023-03-20 ENCOUNTER — Other Ambulatory Visit (HOSPITAL_BASED_OUTPATIENT_CLINIC_OR_DEPARTMENT_OTHER): Payer: Self-pay | Admitting: Nurse Practitioner

## 2023-03-20 DIAGNOSIS — R1013 Epigastric pain: Secondary | ICD-10-CM

## 2023-03-25 ENCOUNTER — Ambulatory Visit (INDEPENDENT_AMBULATORY_CARE_PROVIDER_SITE_OTHER)
Admission: RE | Admit: 2023-03-25 | Discharge: 2023-03-25 | Disposition: A | Discharge status: Home / Self Care | Source: Ambulatory Visit | Attending: Nurse Practitioner | Admitting: Nurse Practitioner

## 2023-03-25 DIAGNOSIS — K8020 Calculus of gallbladder without cholecystitis without obstruction: Secondary | ICD-10-CM | POA: Diagnosis not present

## 2023-03-25 DIAGNOSIS — R1013 Epigastric pain: Secondary | ICD-10-CM

## 2023-03-25 DIAGNOSIS — R1010 Upper abdominal pain, unspecified: Secondary | ICD-10-CM

## 2023-03-25 DIAGNOSIS — R197 Diarrhea, unspecified: Secondary | ICD-10-CM | POA: Diagnosis not present

## 2023-03-25 DIAGNOSIS — N200 Calculus of kidney: Secondary | ICD-10-CM | POA: Diagnosis not present

## 2023-04-07 DIAGNOSIS — K8020 Calculus of gallbladder without cholecystitis without obstruction: Secondary | ICD-10-CM | POA: Diagnosis not present

## 2023-05-05 ENCOUNTER — Ambulatory Visit: Payer: Self-pay | Admitting: General Surgery

## 2023-05-05 NOTE — Pre-Procedure Instructions (Signed)
 Surgical Instructions   Your procedure is scheduled on Monday, May 12th. Report to [facility] Main Entrance "A" at 10:00 A.M., then check in with the Admitting office. Any questions or running late day of surgery: call [phone]  Questions prior to your surgery date: call [phone], Monday-Friday, 8am-4pm. If you experience any cold or flu symptoms such as cough, fever, chills, shortness of breath, etc. between now and your scheduled surgery, please notify us  at the above number.     Remember:  Do not eat after midnight the night before your surgery   You may drink clear liquids until 09:00 AM the morning of your surgery.   Clear liquids allowed are: Water, Non-Citrus Juices (without pulp), Carbonated Beverages, Clear Tea (no milk, honey, etc.), Black Coffee Only (NO MILK, CREAM OR POWDERED CREAMER of any kind), and Gatorade.    Take these medicines the morning of surgery with A SIP OF WATER  May take these medicines IF NEEDED: acetaminophen  (TYLENOL )  ALPRAZolam  (XANAX )  meclizine (ANTIVERT)   One week prior to surgery, STOP taking any Aspirin (unless otherwise instructed by your surgeon) Aleve, Naproxen, Ibuprofen , Motrin , Advil , Goody's, BC's, all herbal medications, fish oil, and non-prescription vitamins.   WHAT DO I DO ABOUT MY DIABETES MEDICATION?     THE MORNING OF SURGERY, take 14 units (50%) of NOVOLIN N FLEXPEN.   HOW TO MANAGE YOUR DIABETES BEFORE AND AFTER SURGERY  Why is it important to control my blood sugar before and after surgery? Improving blood sugar levels before and after surgery helps healing and can limit problems. A way of improving blood sugar control is eating a healthy diet by:  Eating less sugar and carbohydrates  Increasing activity/exercise  Talking with your doctor about reaching your blood sugar goals High blood sugars (greater than 180 mg/dL) can raise your risk of infections and slow your recovery, so you will need to focus on  controlling your diabetes during the weeks before surgery. Make sure that the doctor who takes care of your diabetes knows about your planned surgery including the date and location.  How do I manage my blood sugar before surgery? Check your blood sugar at least 4 times a day, starting 2 days before surgery, to make sure that the level is not too high or low.  Check your blood sugar the morning of your surgery when you wake up and every 2 hours until you get to the Short Stay unit.  If your blood sugar is less than 70 mg/dL, you will need to treat for low blood sugar: Do not take insulin. Treat a low blood sugar (less than 70 mg/dL) with  cup of clear juice (cranberry or apple), 4 glucose tablets, OR glucose gel. Recheck blood sugar in 15 minutes after treatment (to make sure it is greater than 70 mg/dL). If your blood sugar is not greater than 70 mg/dL on recheck, call [phone] for further instructions. Report your blood sugar to the short stay nurse when you get to Short Stay.  If you are admitted to the hospital after surgery: Your blood sugar will be checked by the staff and you will probably be given insulin after surgery (instead of oral diabetes medicines) to make sure you have good blood sugar levels. The goal for blood sugar control after surgery is 80-180 mg/dL.                    Do NOT Smoke (Tobacco/Vaping) for 24 hours prior to your procedure.  If you use a CPAP at night, you may bring your mask/headgear for your overnight stay.   You will be asked to remove any contacts, glasses, piercing's, hearing aid's, dentures/partials prior to surgery. Please bring cases for these items if needed.    Patients discharged the day of surgery will not be allowed to drive home, and someone needs to stay with them for 24 hours.  SURGICAL WAITING ROOM VISITATION Patients may have no more than 2 support people in the waiting area - these visitors may rotate.   Pre-op nurse will  coordinate an appropriate time for 1 ADULT support person, who may not rotate, to accompany patient in pre-op.  Children under the age of 52 must have an adult with them who is not the patient and must remain in the main waiting area with an adult.  If the patient needs to stay at the hospital during part of their recovery, the visitor guidelines for inpatient rooms apply.  Please refer to the [facility] website for the visitor guidelines for any additional information.   If you received a COVID test during your pre-op visit  it is requested that you wear a mask when out in public, stay away from anyone that may not be feeling well and notify your surgeon if you develop symptoms. If you have been in contact with anyone that has tested positive in the last 10 days please notify you surgeon.      Pre-operative CHG Bathing Instructions   You can play a key role in reducing the risk of infection after surgery. Your skin needs to be as free of germs as possible. You can reduce the number of germs on your skin by washing with CHG (chlorhexidine gluconate) soap before surgery. CHG is an antiseptic soap that kills germs and continues to kill germs even after washing.   DO NOT use if you have an allergy to chlorhexidine/CHG or antibacterial soaps. If your skin becomes reddened or irritated, stop using the CHG and notify one of our RNs at [phone].              TAKE A SHOWER THE NIGHT BEFORE SURGERY AND THE DAY OF SURGERY    Please keep in mind the following:  DO NOT shave, including legs and underarms, 48 hours prior to surgery.   You may shave your face before/day of surgery.  Place clean sheets on your bed the night before surgery Use a clean washcloth (not used since being washed) for each shower. DO NOT sleep with pet's night before surgery.  CHG Shower Instructions:  Wash your face and private area with normal soap. If you choose to wash your hair, wash first with your normal shampoo.   After you use shampoo/soap, rinse your hair and body thoroughly to remove shampoo/soap residue.  Turn the water OFF and apply half the bottle of CHG soap to a CLEAN washcloth.  Apply CHG soap ONLY FROM YOUR NECK DOWN TO YOUR TOES (washing for 3-5 minutes)  DO NOT use CHG soap on face, private areas, open wounds, or sores.  Pay special attention to the area where your surgery is being performed.  If you are having back surgery, having someone wash your back for you may be helpful. Wait 2 minutes after CHG soap is applied, then you may rinse off the CHG soap.  Pat dry with a clean towel  Put on clean pajamas    Additional instructions for the day of surgery: DO NOT APPLY  any lotions, deodorants, cologne, or perfumes.   Do not wear jewelry or makeup Do not wear nail polish, gel polish, artificial nails, or any other type of covering on natural nails (fingers and toes) Do not bring valuables to the hospital. [facility] is not responsible for valuables/personal belongings. Put on clean/comfortable clothes.  Please brush your teeth.  Ask your nurse before applying any prescription medications to the skin.

## 2023-05-06 ENCOUNTER — Other Ambulatory Visit: Payer: Self-pay

## 2023-05-06 ENCOUNTER — Encounter (HOSPITAL_COMMUNITY)
Admission: RE | Admit: 2023-05-06 | Discharge: 2023-05-06 | Disposition: A | Discharge status: Home / Self Care | Source: Ambulatory Visit | Attending: General Surgery | Admitting: General Surgery

## 2023-05-06 ENCOUNTER — Encounter (HOSPITAL_COMMUNITY): Payer: Self-pay

## 2023-05-06 VITALS — BP 152/81 | HR 94 | Temp 98.6°F | Resp 18 | Ht 65.0 in | Wt 217.5 lb

## 2023-05-06 DIAGNOSIS — Z794 Long term (current) use of insulin: Secondary | ICD-10-CM | POA: Diagnosis not present

## 2023-05-06 DIAGNOSIS — I10 Essential (primary) hypertension: Secondary | ICD-10-CM | POA: Diagnosis not present

## 2023-05-06 DIAGNOSIS — Z01818 Encounter for other preprocedural examination: Secondary | ICD-10-CM | POA: Diagnosis not present

## 2023-05-06 DIAGNOSIS — E1165 Type 2 diabetes mellitus with hyperglycemia: Secondary | ICD-10-CM | POA: Insufficient documentation

## 2023-05-06 DIAGNOSIS — E119 Type 2 diabetes mellitus without complications: Secondary | ICD-10-CM

## 2023-05-06 DIAGNOSIS — R9431 Abnormal electrocardiogram [ECG] [EKG]: Secondary | ICD-10-CM | POA: Insufficient documentation

## 2023-05-06 DIAGNOSIS — Z6836 Body mass index (BMI) 36.0-36.9, adult: Secondary | ICD-10-CM | POA: Insufficient documentation

## 2023-05-06 DIAGNOSIS — E669 Obesity, unspecified: Secondary | ICD-10-CM | POA: Diagnosis not present

## 2023-05-06 HISTORY — DX: Unspecified osteoarthritis, unspecified site: M19.90

## 2023-05-06 HISTORY — DX: Pneumonia, unspecified organism: J18.9

## 2023-05-06 LAB — HEMOGLOBIN A1C
Hgb A1c MFr Bld: 9 % — ABNORMAL HIGH (ref 4.8–5.6)
Mean Plasma Glucose: 211.6 mg/dL

## 2023-05-06 LAB — CBC
HCT: 41.7 % (ref 36.0–46.0)
Hemoglobin: 13.9 g/dL (ref 12.0–15.0)
MCH: 31.5 pg (ref 26.0–34.0)
MCHC: 33.3 g/dL (ref 30.0–36.0)
MCV: 94.6 fL (ref 80.0–100.0)
Platelets: 179 10*3/uL (ref 150–400)
RBC: 4.41 MIL/uL (ref 3.87–5.11)
RDW: 12.5 % (ref 11.5–15.5)
WBC: 8.9 10*3/uL (ref 4.0–10.5)
nRBC: 0 % (ref 0.0–0.2)

## 2023-05-06 LAB — BASIC METABOLIC PANEL WITH GFR
Anion gap: 10 (ref 5–15)
BUN: 11 mg/dL (ref 6–20)
CO2: 30 mmol/L (ref 22–32)
Calcium: 9.3 mg/dL (ref 8.9–10.3)
Chloride: 95 mmol/L — ABNORMAL LOW (ref 98–111)
Creatinine, Ser: 0.81 mg/dL (ref 0.44–1.00)
GFR, Estimated: >60 mL/min (ref >60)
Glucose, Bld: 367 mg/dL — ABNORMAL HIGH (ref 70–99)
Potassium: 4.9 mmol/L (ref 3.5–5.1)
Sodium: 135 mmol/L (ref 135–145)

## 2023-05-06 LAB — GLUCOSE, CAPILLARY: Glucose-Capillary: 351 mg/dL — ABNORMAL HIGH (ref 70–99)

## 2023-05-06 NOTE — Pre-Procedure Instructions (Signed)
 Surgical Instructions   Your procedure is scheduled on Monday, May 12th. Report to [facility] Main Entrance "A" at 10:00 A.M., then check in with the Admitting office. Any questions or running late day of surgery: call [phone]  Questions prior to your surgery date: call [phone], Monday-Friday, 8am-4pm. If you experience any cold or flu symptoms such as cough, fever, chills, shortness of breath, etc. between now and your scheduled surgery, please notify us  at the above number.     Remember:  Do not eat after midnight the night before your surgery   You may drink clear liquids until 09:00 AM the morning of your surgery.   Clear liquids allowed are: Water, Non-Citrus Juices (without pulp), Carbonated Beverages, Clear Tea (no milk, honey, etc.), Black Coffee Only (NO MILK, CREAM OR POWDERED CREAMER of any kind), and Gatorade.  Patient Instructions  The night before surgery:  No food after midnight. ONLY clear liquids after midnight  The day of surgery (if you have diabetes): Drink ONE (1) 12 oz G2 given to you in your pre admission testing appointment by 9:00 AM the morning of surgery. Drink in one sitting. Do not sip.  This drink was given to you during your hospital  pre-op appointment visit.  Nothing else to drink after completing the  12 oz bottle of G2.         If you have questions, please contact your surgeon's office.    Take these medicines the morning of surgery with A SIP OF WATER  May take these medicines IF NEEDED: acetaminophen  (TYLENOL )  ALPRAZolam  (XANAX )  meclizine (ANTIVERT)    One week prior to surgery, STOP taking any Aspirin (unless otherwise instructed by your surgeon) Aleve, Naproxen, Ibuprofen , Motrin , Advil , Goody's, BC's, all herbal medications, fish oil, and non-prescription vitamins.    WHAT DO I DO ABOUT MY DIABETES MEDICATION?     THE MORNING OF SURGERY, take 14 units (50%) of NOVOLIN N FLEXPEN.   HOW TO MANAGE YOUR DIABETES BEFORE  AND AFTER SURGERY  Why is it important to control my blood sugar before and after surgery? Improving blood sugar levels before and after surgery helps healing and can limit problems. A way of improving blood sugar control is eating a healthy diet by:  Eating less sugar and carbohydrates  Increasing activity/exercise  Talking with your doctor about reaching your blood sugar goals High blood sugars (greater than 180 mg/dL) can raise your risk of infections and slow your recovery, so you will need to focus on controlling your diabetes during the weeks before surgery. Make sure that the doctor who takes care of your diabetes knows about your planned surgery including the date and location.  How do I manage my blood sugar before surgery? Check your blood sugar at least 4 times a day, starting 2 days before surgery, to make sure that the level is not too high or low.  Check your blood sugar the morning of your surgery when you wake up and every 2 hours until you get to the Short Stay unit.  If your blood sugar is less than 70 mg/dL, you will need to treat for low blood sugar: Do not take insulin. Treat a low blood sugar (less than 70 mg/dL) with  cup of clear juice (cranberry or apple), 4 glucose tablets, OR glucose gel. Recheck blood sugar in 15 minutes after treatment (to make sure it is greater than 70 mg/dL). If your blood sugar is not greater than 70 mg/dL on recheck,  call [phone] for further instructions. Report your blood sugar to the short stay nurse when you get to Short Stay.  If you are admitted to the hospital after surgery: Your blood sugar will be checked by the staff and you will probably be given insulin after surgery (instead of oral diabetes medicines) to make sure you have good blood sugar levels. The goal for blood sugar control after surgery is 80-180 mg/dL.                    Do NOT Smoke (Tobacco/Vaping) for 24 hours prior to your procedure.  If you use a CPAP at  night, you may bring your mask/headgear for your overnight stay.   You will be asked to remove any contacts, glasses, piercing's, hearing aid's, dentures/partials prior to surgery. Please bring cases for these items if needed.    Patients discharged the day of surgery will not be allowed to drive home, and someone needs to stay with them for 24 hours.  SURGICAL WAITING ROOM VISITATION Patients may have no more than 2 support people in the waiting area - these visitors may rotate.   Pre-op nurse will coordinate an appropriate time for 1 ADULT support person, who may not rotate, to accompany patient in pre-op.  Children under the age of 63 must have an adult with them who is not the patient and must remain in the main waiting area with an adult.  If the patient needs to stay at the hospital during part of their recovery, the visitor guidelines for inpatient rooms apply.  Please refer to the [facility] website for the visitor guidelines for any additional information.   If you received a COVID test during your pre-op visit  it is requested that you wear a mask when out in public, stay away from anyone that may not be feeling well and notify your surgeon if you develop symptoms. If you have been in contact with anyone that has tested positive in the last 10 days please notify you surgeon.      Pre-operative CHG Bathing Instructions   You can play a key role in reducing the risk of infection after surgery. Your skin needs to be as free of germs as possible. You can reduce the number of germs on your skin by washing with CHG (chlorhexidine gluconate) soap before surgery. CHG is an antiseptic soap that kills germs and continues to kill germs even after washing.   DO NOT use if you have an allergy to chlorhexidine/CHG or antibacterial soaps. If your skin becomes reddened or irritated, stop using the CHG and notify one of our RNs at [phone].              TAKE A SHOWER THE NIGHT BEFORE SURGERY  AND THE DAY OF SURGERY    Please keep in mind the following:  DO NOT shave, including legs and underarms, 48 hours prior to surgery.   You may shave your face before/day of surgery.  Place clean sheets on your bed the night before surgery Use a clean washcloth (not used since being washed) for each shower. DO NOT sleep with pet's night before surgery.  CHG Shower Instructions:  Wash your face and private area with normal soap. If you choose to wash your hair, wash first with your normal shampoo.  After you use shampoo/soap, rinse your hair and body thoroughly to remove shampoo/soap residue.  Turn the water OFF and apply half the bottle of CHG soap to  a Animal nutritionist.  Apply CHG soap ONLY FROM YOUR NECK DOWN TO YOUR TOES (washing for 3-5 minutes)  DO NOT use CHG soap on face, private areas, open wounds, or sores.  Pay special attention to the area where your surgery is being performed.  If you are having back surgery, having someone wash your back for you may be helpful. Wait 2 minutes after CHG soap is applied, then you may rinse off the CHG soap.  Pat dry with a clean towel  Put on clean pajamas    Additional instructions for the day of surgery: DO NOT APPLY any lotions, deodorants, cologne, or perfumes.   Do not wear jewelry or makeup Do not wear nail polish, gel polish, artificial nails, or any other type of covering on natural nails (fingers and toes) Do not bring valuables to the hospital. [facility] is not responsible for valuables/personal belongings. Put on clean/comfortable clothes.  Please brush your teeth.  Ask your nurse before applying any prescription medications to the skin.

## 2023-05-06 NOTE — Progress Notes (Addendum)
 PCP - Nicholas Bari with [facility] in [place] Cardiologist - Denies  PPM/ICD - Denies Device Orders - n/a Rep Notified - n/a  Chest x-ray - n/a EKG - 05/06/2023 Stress Test - Denies ECHO - Denies Cardiac Cath - Denies  Sleep Study - Denies CPAP - n/a  Pt is DM2. She only checks her blood sugar when she feels symptomatic. She does not know her normal fasting glucose. CBG at pre-op appointment 351. Today she has had fish filet sandwich with fries from [employer], Pepsi and sweet tea. She does not feel symptomatic with this blood sugar. A1c result pending.  Last dose of GLP1 agonist- n/a  GLP1 instructions: n/a  Blood Thinner Instructions: n/a Aspirin Instructions: n/a  ERAS Protcol - Clear liquids until 0900 morning of surgery PRE-SURGERY Ensure or G2- G2 given to pt with instructions  COVID TEST- n/a   Anesthesia review: Yes. CBG 351 at appointment. Hx of DM2 and HTN. EKG review and last office note requested from PCP. A1c result 9.0  Patient denies shortness of breath, fever, cough and chest pain at PAT appointment. Pt denies any respiratory illness/infection in the last two months.   All instructions explained to the patient, with a verbal understanding of the material. Patient agrees to go over the instructions while at home for a better understanding. Patient also instructed to self quarantine after being tested for COVID-19. The opportunity to ask questions was provided.

## 2023-05-08 DIAGNOSIS — K819 Cholecystitis, unspecified: Secondary | ICD-10-CM | POA: Diagnosis not present

## 2023-05-08 DIAGNOSIS — I10 Essential (primary) hypertension: Secondary | ICD-10-CM | POA: Diagnosis not present

## 2023-05-08 DIAGNOSIS — E119 Type 2 diabetes mellitus without complications: Secondary | ICD-10-CM | POA: Diagnosis not present

## 2023-05-08 NOTE — Anesthesia Preprocedure Evaluation (Addendum)
 Anesthesia Evaluation  Patient identified by MRN, date of birth, ID band Patient awake    Reviewed: Allergy & Precautions, NPO status , Patient's Chart, lab work & pertinent test results  Airway Mallampati: II  TM Distance: >3 FB Neck ROM: Full    Dental no notable dental hx. (+) Teeth Intact, Dental Advisory Given   Pulmonary former smoker   Pulmonary exam normal breath sounds clear to auscultation       Cardiovascular hypertension, Normal cardiovascular exam Rhythm:Regular Rate:Normal     Neuro/Psych   Anxiety Depression       GI/Hepatic   Endo/Other  diabetes, Type 2    Renal/GU Lab Results      Component                Value               Date                      NA                       135                 05/06/2023                CL                       95 (L)              05/06/2023                K                        4.9                 05/06/2023                CO2                      30                  05/06/2023                BUN                      11                  05/06/2023                CREATININE               0.81                05/06/2023                GFRNONAA                 >60                 05/06/2023                CALCIUM                  9.3                 05/06/2023  GLUCOSE                  367 (H)             05/06/2023                Musculoskeletal  (+) Arthritis ,    Abdominal  (+) + obese  Peds  Hematology Lab Results      Component                Value               Date                      WBC                      8.9                 05/06/2023                HGB                      13.9                05/06/2023                HCT                      41.7                05/06/2023                MCV                      94.6                05/06/2023                PLT                      179                 05/06/2023               Anesthesia Other Findings   Reproductive/Obstetrics                             Anesthesia Physical Anesthesia Plan  ASA: 3  Anesthesia Plan: General   Post-op Pain Management: Tylenol  PO (pre-op)*   Induction: Intravenous  PONV Risk Score and Plan: 4 or greater and Treatment may vary due to age or medical condition, Midazolam, Ondansetron, Dexamethasone and Scopolamine patch - Pre-op  Airway Management Planned: Oral ETT  Additional Equipment: None  Intra-op Plan:   Post-operative Plan: Extubation in OR  Informed Consent: I have reviewed the patients History and Physical, chart, labs and discussed the procedure including the risks, benefits and alternatives for the proposed anesthesia with the patient or authorized representative who has indicated his/her understanding and acceptance.     Dental advisory given  Plan Discussed with: CRNA  Anesthesia Plan Comments:         Anesthesia Quick Evaluation

## 2023-05-08 NOTE — Progress Notes (Signed)
 Anesthesia Chart Review:  58 yo female with pertinent hx including HTN, ? Kidney cancer, PTSD/anxiety, IDDM2, obesity BMI 36.  History of renal cancer is unclear. Per urology note by Dr. Estanislao Heimlich 11/19/21, "I have performed extensive chart review and I do not see any mention of kidney cancer in any records or imaging.  When I asked her about this she said " she had the records sealed by an attorney because she did not want her dad to know she had cancer."  When I asked for further information she stated it was very expensive to have the records sealed and she could not give me any more information.  She would not tell me where this was done or who her urologist was. She reports she had chemotherapy and radiation."  Markedly uncontrolled DM2. Pt reports she does not monitor her BG. Proep labs show BG 367 and A1c 9.0. Dr. Hildy Lowers is aware.  Remainder of Preop labs unremarkable.   EKG 05/06/23: Normal sinus rhythm. Rate 87. Prolonged QT (QTcB 486).    Elizabeth Daniel [facility] Short Stay Center/Anesthesiology Phone [phone] 05/08/2023 12:40 PM

## 2023-05-18 ENCOUNTER — Encounter (HOSPITAL_COMMUNITY)
Admission: RE | Disposition: A | Discharge status: Home / Self Care | Payer: Self-pay | Source: Home / Self Care | Attending: General Surgery

## 2023-05-18 ENCOUNTER — Ambulatory Visit (HOSPITAL_COMMUNITY)
Admission: RE | Admit: 2023-05-18 | Discharge: 2023-05-18 | Disposition: A | Discharge status: Home / Self Care | Attending: General Surgery | Admitting: General Surgery

## 2023-05-18 ENCOUNTER — Other Ambulatory Visit (HOSPITAL_COMMUNITY): Payer: Self-pay

## 2023-05-18 ENCOUNTER — Encounter (HOSPITAL_COMMUNITY): Payer: Self-pay | Admitting: General Surgery

## 2023-05-18 ENCOUNTER — Ambulatory Visit (HOSPITAL_BASED_OUTPATIENT_CLINIC_OR_DEPARTMENT_OTHER): Payer: Self-pay | Admitting: Anesthesiology

## 2023-05-18 ENCOUNTER — Other Ambulatory Visit: Payer: Self-pay

## 2023-05-18 ENCOUNTER — Ambulatory Visit (HOSPITAL_COMMUNITY): Payer: Self-pay | Admitting: Vascular Surgery

## 2023-05-18 DIAGNOSIS — Z87891 Personal history of nicotine dependence: Secondary | ICD-10-CM | POA: Diagnosis not present

## 2023-05-18 DIAGNOSIS — Z79899 Other long term (current) drug therapy: Secondary | ICD-10-CM | POA: Diagnosis not present

## 2023-05-18 DIAGNOSIS — I10 Essential (primary) hypertension: Secondary | ICD-10-CM | POA: Diagnosis not present

## 2023-05-18 DIAGNOSIS — Z87442 Personal history of urinary calculi: Secondary | ICD-10-CM | POA: Insufficient documentation

## 2023-05-18 DIAGNOSIS — K8010 Calculus of gallbladder with chronic cholecystitis without obstruction: Secondary | ICD-10-CM | POA: Diagnosis not present

## 2023-05-18 DIAGNOSIS — F419 Anxiety disorder, unspecified: Secondary | ICD-10-CM | POA: Insufficient documentation

## 2023-05-18 DIAGNOSIS — M1990 Unspecified osteoarthritis, unspecified site: Secondary | ICD-10-CM | POA: Insufficient documentation

## 2023-05-18 DIAGNOSIS — F32A Depression, unspecified: Secondary | ICD-10-CM | POA: Diagnosis not present

## 2023-05-18 DIAGNOSIS — Z794 Long term (current) use of insulin: Secondary | ICD-10-CM | POA: Diagnosis not present

## 2023-05-18 DIAGNOSIS — E669 Obesity, unspecified: Secondary | ICD-10-CM | POA: Insufficient documentation

## 2023-05-18 DIAGNOSIS — F418 Other specified anxiety disorders: Secondary | ICD-10-CM | POA: Diagnosis not present

## 2023-05-18 DIAGNOSIS — E119 Type 2 diabetes mellitus without complications: Secondary | ICD-10-CM | POA: Diagnosis not present

## 2023-05-18 DIAGNOSIS — Z6836 Body mass index (BMI) 36.0-36.9, adult: Secondary | ICD-10-CM | POA: Insufficient documentation

## 2023-05-18 DIAGNOSIS — K8020 Calculus of gallbladder without cholecystitis without obstruction: Secondary | ICD-10-CM | POA: Diagnosis not present

## 2023-05-18 HISTORY — PX: CHOLECYSTECTOMY: SHX55

## 2023-05-18 LAB — GLUCOSE, CAPILLARY
Glucose-Capillary: 157 mg/dL — ABNORMAL HIGH (ref 70–99)
Glucose-Capillary: 135 mg/dL — ABNORMAL HIGH (ref 70–99)

## 2023-05-18 SURGERY — LAPAROSCOPIC CHOLECYSTECTOMY
Anesthesia: General | Site: Abdomen

## 2023-05-18 MED ORDER — KETOROLAC TROMETHAMINE 30 MG/ML IJ SOLN
30.0000 mg | Freq: Once | INTRAMUSCULAR | Status: AC | PRN
  Administered 2023-05-18: 30 mg via INTRAVENOUS

## 2023-05-18 MED ORDER — ROCURONIUM BROMIDE 10 MG/ML (PF) SYRINGE
PREFILLED_SYRINGE | INTRAVENOUS | Status: AC
  Filled 2023-05-18: qty 10

## 2023-05-18 MED ORDER — HEMOSTATIC AGENTS (NO CHARGE) OPTIME
TOPICAL | Status: DC | PRN
  Administered 2023-05-18: 1 via TOPICAL

## 2023-05-18 MED ORDER — CEFAZOLIN SODIUM-DEXTROSE 2-4 GM/100ML-% IV SOLN
INTRAVENOUS | Status: AC
  Filled 2023-05-18: qty 100

## 2023-05-18 MED ORDER — SUGAMMADEX SODIUM 200 MG/2ML IV SOLN
INTRAVENOUS | Status: DC | PRN
  Administered 2023-05-18: 200 mg via INTRAVENOUS

## 2023-05-18 MED ORDER — ACETAMINOPHEN 10 MG/ML IV SOLN
1000.0000 mg | Freq: Once | INTRAVENOUS | Status: AC
  Administered 2023-05-18: 1000 mg via INTRAVENOUS

## 2023-05-18 MED ORDER — SODIUM CHLORIDE (PF) 0.9 % IJ SOLN
INTRAMUSCULAR | Status: AC
  Filled 2023-05-18: qty 10

## 2023-05-18 MED ORDER — EPHEDRINE SULFATE-NACL 50-0.9 MG/10ML-% IV SOSY
PREFILLED_SYRINGE | INTRAVENOUS | Status: DC | PRN
  Administered 2023-05-18: 10 mg via INTRAVENOUS

## 2023-05-18 MED ORDER — ROCURONIUM BROMIDE 10 MG/ML (PF) SYRINGE
PREFILLED_SYRINGE | INTRAVENOUS | Status: DC | PRN
  Administered 2023-05-18: 60 mg via INTRAVENOUS

## 2023-05-18 MED ORDER — MIDAZOLAM HCL 2 MG/2ML IJ SOLN
INTRAMUSCULAR | Status: DC | PRN
Start: 2023-05-18 — End: 2023-05-18
  Administered 2023-05-18: 2 mg via INTRAVENOUS

## 2023-05-18 MED ORDER — OXYCODONE HCL 5 MG PO TABS: 5.0000 mg | ORAL_TABLET | Freq: Once | ORAL | Status: DC | PRN

## 2023-05-18 MED ORDER — ENSURE PRE-SURGERY PO LIQD
296.0000 mL | Freq: Once | ORAL | Status: DC
Start: 2023-05-19 — End: 2023-05-18

## 2023-05-18 MED ORDER — BUPIVACAINE-EPINEPHRINE (PF) 0.25% -1:200000 IJ SOLN
INTRAMUSCULAR | Status: AC
  Filled 2023-05-18: qty 30

## 2023-05-18 MED ORDER — ACETAMINOPHEN 10 MG/ML IV SOLN
INTRAVENOUS | Status: AC
  Filled 2023-05-18: qty 100

## 2023-05-18 MED ORDER — FENTANYL CITRATE (PF) 250 MCG/5ML IJ SOLN
INTRAMUSCULAR | Status: DC | PRN
  Administered 2023-05-18 (×2): 50 ug via INTRAVENOUS

## 2023-05-18 MED ORDER — CHLORHEXIDINE GLUCONATE 0.12 % MT SOLN: 15.0000 mL | Freq: Once | OROMUCOSAL | Status: AC

## 2023-05-18 MED ORDER — CHLORHEXIDINE GLUCONATE CLOTH 2 % EX PADS: 6.0000 | MEDICATED_PAD | Freq: Once | CUTANEOUS | Status: DC

## 2023-05-18 MED ORDER — ONDANSETRON HCL 4 MG/2ML IJ SOLN
INTRAMUSCULAR | Status: DC | PRN
  Administered 2023-05-18: 4 mg via INTRAVENOUS

## 2023-05-18 MED ORDER — OXYCODONE HCL 5 MG/5ML PO SOLN: 5.0000 mg | Freq: Once | ORAL | Status: DC | PRN

## 2023-05-18 MED ORDER — PROPOFOL 10 MG/ML IV BOLUS
INTRAVENOUS | Status: DC | PRN
  Administered 2023-05-18: 170 mg via INTRAVENOUS

## 2023-05-18 MED ORDER — SCOPOLAMINE 1 MG/3DAYS TD PT72
1.0000 | MEDICATED_PATCH | TRANSDERMAL | Status: DC
  Administered 2023-05-18: 1.5 mg via TRANSDERMAL
  Filled 2023-05-18: qty 1

## 2023-05-18 MED ORDER — FENTANYL CITRATE (PF) 100 MCG/2ML IJ SOLN
INTRAMUSCULAR | Status: AC
  Filled 2023-05-18: qty 2

## 2023-05-18 MED ORDER — ONDANSETRON HCL 4 MG/2ML IJ SOLN: 4.0000 mg | Freq: Once | INTRAMUSCULAR | Status: DC | PRN

## 2023-05-18 MED ORDER — HYDROMORPHONE HCL 1 MG/ML IJ SOLN
0.2500 mg | INTRAMUSCULAR | Status: DC | PRN
  Administered 2023-05-18 (×2): 0.5 mg via INTRAVENOUS

## 2023-05-18 MED ORDER — 0.9 % SODIUM CHLORIDE (POUR BTL) OPTIME
TOPICAL | Status: DC | PRN
  Administered 2023-05-18: 1000 mL

## 2023-05-18 MED ORDER — MIDAZOLAM HCL 2 MG/2ML IJ SOLN
INTRAMUSCULAR | Status: AC
  Filled 2023-05-18: qty 2

## 2023-05-18 MED ORDER — HYDROMORPHONE HCL 1 MG/ML IJ SOLN
INTRAMUSCULAR | Status: DC
Start: 2023-05-18 — End: 2023-05-18
  Filled 2023-05-18: qty 1

## 2023-05-18 MED ORDER — METHOCARBAMOL 750 MG PO TABS
750.0000 mg | ORAL_TABLET | Freq: Three times a day (TID) | ORAL | 0 refills | Status: DC
Start: 2023-05-18 — End: 2023-09-23
  Filled 2023-05-18: qty 21, 7d supply, fill #0

## 2023-05-18 MED ORDER — CEFAZOLIN SODIUM-DEXTROSE 2-4 GM/100ML-% IV SOLN: 2.0000 g | INTRAVENOUS | Status: DC

## 2023-05-18 MED ORDER — SODIUM CHLORIDE 0.9 % IR SOLN
Status: DC | PRN
  Administered 2023-05-18: 1000 mL

## 2023-05-18 MED ORDER — KETOROLAC TROMETHAMINE 30 MG/ML IJ SOLN
INTRAMUSCULAR | Status: AC
  Filled 2023-05-18: qty 1

## 2023-05-18 MED ORDER — PROPOFOL 10 MG/ML IV BOLUS
INTRAVENOUS | Status: AC
  Filled 2023-05-18: qty 20

## 2023-05-18 MED ORDER — LIDOCAINE 2% (20 MG/ML) 5 ML SYRINGE
INTRAMUSCULAR | Status: AC
  Filled 2023-05-18: qty 5

## 2023-05-18 MED ORDER — BUPIVACAINE-EPINEPHRINE 0.25% -1:200000 IJ SOLN
INTRAMUSCULAR | Status: DC | PRN
  Administered 2023-05-18: 15 mL

## 2023-05-18 MED ORDER — PHENYLEPHRINE 80 MCG/ML (10ML) SYRINGE FOR IV PUSH (FOR BLOOD PRESSURE SUPPORT)
PREFILLED_SYRINGE | INTRAVENOUS | Status: AC
  Filled 2023-05-18: qty 10

## 2023-05-18 MED ORDER — ORAL CARE MOUTH RINSE: 15.0000 mL | Freq: Once | OROMUCOSAL | Status: AC

## 2023-05-18 MED ORDER — LIDOCAINE 2% (20 MG/ML) 5 ML SYRINGE
INTRAMUSCULAR | Status: DC | PRN
  Administered 2023-05-18: 100 mg via INTRAVENOUS

## 2023-05-18 MED ORDER — HYDROMORPHONE HCL 1 MG/ML IJ SOLN
INTRAMUSCULAR | Status: AC
  Filled 2023-05-18: qty 0.5

## 2023-05-18 MED ORDER — DEXAMETHASONE SODIUM PHOSPHATE 10 MG/ML IJ SOLN
INTRAMUSCULAR | Status: AC
  Filled 2023-05-18: qty 1

## 2023-05-18 MED ORDER — LACTATED RINGERS IV SOLN: INTRAVENOUS | Status: DC

## 2023-05-18 MED ORDER — CHLORHEXIDINE GLUCONATE 0.12 % MT SOLN
OROMUCOSAL | Status: AC
  Administered 2023-05-18: 15 mL via OROMUCOSAL
  Filled 2023-05-18: qty 15

## 2023-05-18 MED ORDER — TRAMADOL HCL 50 MG PO TABS
50.0000 mg | ORAL_TABLET | Freq: Four times a day (QID) | ORAL | 0 refills | Status: AC | PRN
  Filled 2023-05-18: qty 20, 5d supply, fill #0

## 2023-05-18 MED ORDER — HYDROMORPHONE HCL 1 MG/ML IJ SOLN
INTRAMUSCULAR | Status: DC | PRN
  Administered 2023-05-18: .5 mg via INTRAVENOUS

## 2023-05-18 SURGICAL SUPPLY — 36 items
BAG COUNTER SPONGE SURGICOUNT (BAG) ×1 IMPLANT
BLADE CLIPPER SURG (BLADE) IMPLANT
CANISTER SUCTION 3000ML PPV (SUCTIONS) ×1 IMPLANT
CHLORAPREP W/TINT 26 (MISCELLANEOUS) ×1 IMPLANT
CLIP APPLIE 5 13 M/L LIGAMAX5 (MISCELLANEOUS) ×1 IMPLANT
CNTNR URN SCR LID CUP LEK RST (MISCELLANEOUS) IMPLANT
COVER SURGICAL LIGHT HANDLE (MISCELLANEOUS) ×1 IMPLANT
DERMABOND ADVANCED .7 DNX12 (GAUZE/BANDAGES/DRESSINGS) ×1 IMPLANT
ELECTRODE REM PT RTRN 9FT ADLT (ELECTROSURGICAL) ×1 IMPLANT
GLOVE BIO SURGEON STRL SZ8 (GLOVE) ×1 IMPLANT
GLOVE BIOGEL PI IND STRL 8 (GLOVE) ×1 IMPLANT
GOWN STRL REUS W/ TWL LRG LVL3 (GOWN DISPOSABLE) ×2 IMPLANT
GOWN STRL REUS W/ TWL XL LVL3 (GOWN DISPOSABLE) ×1 IMPLANT
HEMOSTAT SNOW SURGICEL 2X4 (HEMOSTASIS) IMPLANT
IRRIGATION SUCT STRKRFLW 2 WTP (MISCELLANEOUS) ×1 IMPLANT
KIT BASIN OR (CUSTOM PROCEDURE TRAY) ×1 IMPLANT
KIT TURNOVER KIT B (KITS) ×1 IMPLANT
LHOOK LAP DISP 36CM (ELECTROSURGICAL) ×1 IMPLANT
NDL 22X1.5 STRL (OR ONLY) (MISCELLANEOUS) ×1 IMPLANT
NEEDLE 22X1.5 STRL (OR ONLY) (MISCELLANEOUS) ×1 IMPLANT
NS IRRIG 1000ML POUR BTL (IV SOLUTION) ×1 IMPLANT
PAD ARMBOARD POSITIONER FOAM (MISCELLANEOUS) ×1 IMPLANT
PENCIL BUTTON HOLSTER BLD 10FT (ELECTRODE) ×1 IMPLANT
POUCH RETRIEVAL ECOSAC 10 (ENDOMECHANICALS) ×1 IMPLANT
SCISSORS LAP 5X35 DISP (ENDOMECHANICALS) ×1 IMPLANT
SET TUBE SMOKE EVAC HIGH FLOW (TUBING) ×1 IMPLANT
SLEEVE Z-THREAD 5X100MM (TROCAR) ×2 IMPLANT
SPECIMEN JAR SMALL (MISCELLANEOUS) ×1 IMPLANT
SUT VIC AB 4-0 PS2 27 (SUTURE) ×1 IMPLANT
TOWEL GREEN STERILE (TOWEL DISPOSABLE) ×1 IMPLANT
TOWEL GREEN STERILE FF (TOWEL DISPOSABLE) ×1 IMPLANT
TRAY LAPAROSCOPIC MC (CUSTOM PROCEDURE TRAY) ×1 IMPLANT
TROCAR BALLN 12MMX100 BLUNT (TROCAR) ×1 IMPLANT
TROCAR Z-THREAD OPTICAL 5X100M (TROCAR) ×1 IMPLANT
WARMER LAPAROSCOPE (MISCELLANEOUS) ×1 IMPLANT
WATER STERILE IRR 1000ML POUR (IV SOLUTION) ×1 IMPLANT

## 2023-05-18 NOTE — Anesthesia Postprocedure Evaluation (Signed)
 Anesthesia Post Note  Patient: Elizabeth Daniel  Procedure(s) Performed: LAPAROSCOPIC CHOLECYSTECTOMY (Abdomen)     Patient location during evaluation: PACU Anesthesia Type: General Level of consciousness: awake and alert Pain management: pain level controlled Vital Signs Assessment: post-procedure vital signs reviewed and stable Respiratory status: spontaneous breathing, nonlabored ventilation, respiratory function stable and patient connected to nasal cannula oxygen Cardiovascular status: blood pressure returned to baseline and stable Postop Assessment: no apparent nausea or vomiting Anesthetic complications: no  No notable events documented.  Last Vitals:  Vitals:   05/18/23 1415 05/18/23 1430  BP: (!) 162/77 135/89  Pulse: 75 71  Resp: (!) 24 17  Temp:  36.5 C  SpO2: 96% 94%    Last Pain:  Vitals:   05/18/23 1430  TempSrc:   PainSc: 0-No pain                 Rosalita Combe

## 2023-05-18 NOTE — Progress Notes (Signed)
 CBG 157. Per Dr. Lasalle Pointer to recheck in 2 hrs since pt had Novolin 15 units at 8 am. Per pt she has upper dentures that are "glued really tight." Per dr. Lasalle Pointer try to remove if cannot be removed -let him know.

## 2023-05-18 NOTE — Transfer of Care (Signed)
 Immediate Anesthesia Transfer of Care Note  Patient: Elizabeth Daniel  Procedure(s) Performed: LAPAROSCOPIC CHOLECYSTECTOMY (Abdomen)  Patient Location: PACU  Anesthesia Type:General  Level of Consciousness: awake, oriented, and patient cooperative  Airway & Oxygen Therapy: Patient Spontanous Breathing and Patient connected to nasal cannula oxygen  Post-op Assessment: Report given to RN, Post -op Vital signs reviewed and stable, and Patient moving all extremities X 4  Post vital signs: Reviewed and stable  Last Vitals:  Vitals Value Taken Time  BP 183/87 05/18/23 1347  Temp 97.7   Pulse 66 05/18/23 1350  Resp 17 05/18/23 1350  SpO2 94 % 05/18/23 1350  Vitals shown include unfiled device data.  Last Pain:  Vitals:   05/18/23 1039  TempSrc: Oral  PainSc:          Complications: No notable events documented.

## 2023-05-18 NOTE — Op Note (Signed)
  05/18/2023  1:38 PM  PATIENT:  Elizabeth Daniel  58 y.o. female  PRE-OPERATIVE DIAGNOSIS:  SYMPTOMATIC CHOLELITHIASIS  POST-OPERATIVE DIAGNOSIS:  SYMPTOMATIC CHOLELITHIASIS  PROCEDURE:  Procedure(s): LAPAROSCOPIC CHOLECYSTECTOMY  SURGEON:  Surgeon(s): Dorena Gander, MD Edmon Gosling, MD  ASSISTANTS: Gaetana Jones, MD   ANESTHESIA:   local and general  EBL:  Total I/O In: 1000 [I.V.:1000] Out: 200 [Blood:200]  BLOOD ADMINISTERED:none  DRAINS: none   SPECIMEN:  Excision  DISPOSITION OF SPECIMEN:  PATHOLOGY  COUNTS:  YES  DICTATION: .Dragon Dictation Procedure detail: Informed consent was obtained.  She received intravenous antibiotics.  She was brought to the operating room and general endotracheal anesthesia was administered by the anesthesia staff.  Her abdomen was prepped and draped in a sterile fashion.  Timeout procedure was performed.The infraumbilical region was infiltrated with local. Infraumbilical incision was made. Subcutaneous tissues were dissected down revealing the anterior fascia. This was divided sharply along the midline. Peritoneal cavity was entered under direct vision without complication. A 0 Vicryl pursestring was placed around the fascial opening. Hassan trocar was inserted into the abdomen. The abdomen was insufflated with carbon dioxide in standard fashion. Under direct vision a 5 mm epigastric and 5 mm right abdominal port x 2 were placed.  Local was used at each port site.  Laparoscopic exploration revealed an inflamed gallbladder with omentum stuck to the body.  The dome was retracted superior and medially.  The omentum was gently dissected off of the body of the gallbladder eventually revealing the infundibulum.  The infundibulum was retracted inferiorly and laterally.  Dissection began laterally and progressed medially identifying the cystic duct.  We continued the dissection until we had a critical view of safety.  Once this was achieved, 3 clips  were placed proximally and 1 was placed distally on the cystic duct.  Cystic duct was divided.  Cystic artery was then identified and clipped twice proximally and once distally.  It was divided.  The gallbladder was taken off the liver bed using Bovie cautery.  We did identify a posterior branch of the cystic artery there was initially a little bit hard to control but we were able to get clips on it.  The gallbladder was removed from the liver bed and placed in a bag.  It was removed from the abdomen and sent to pathology.  The liver bed was then cauterized and we achieved hemostasis.  The area was irrigated.  Placed a piece of Surgicel snow down lower where the previous bleeding was.  Everything appeared dry.  Irrigation fluid was evacuated.  Liver bed was rechecked and looked dry.  Ports were removed under direct vision.  Pneumoperitoneum was released.  Infraumbilical fascia was closed by tying the pursestring.  All 4 wounds were closed with 4-0 Vicryl followed by Dermabond.  All counts were correct.  She tolerated the procedure well without apparent complication was taken recovery in stable condition.  PATIENT DISPOSITION:  PACU - hemodynamically stable.   Delay start of Pharmacological VTE agent (>24hrs) due to surgical blood loss or risk of bleeding:  no  Dorena Gander, MD, MPH, FACS Pager: [phone]  5/12/20251:38 PM

## 2023-05-18 NOTE — H&P (Signed)
 Elizabeth Daniel is an 58 y.o. female.   Chief Complaint: RUQ pain HPI: 58yo F presents for laparoscopic cholecystectomy.  She has had some additional attacks of right upper quadrant pain and bloating since I saw her in the office.  No other new issues.  Past Medical History:  Diagnosis Date   Anemia    Arthritis    Cancer (HCC) 2019   Right Kidney Cancer - Chemotherapy   Diabetes mellitus without complication (HCC)    Type 2   Dyspnea    increased exertion    History of kidney stones    Hypertension    Panic attacks    Pneumonia     Past Surgical History:  Procedure Laterality Date   BREAST SURGERY     exploratory surgery secondary to recurring infections   EXTRACORPOREAL SHOCK WAVE LITHOTRIPSY Right 01/15/2017   Procedure: RIGHT EXTRACORPOREAL SHOCK WAVE LITHOTRIPSY (ESWL);  Surgeon: Andrez Banker, MD;  Location: [facility];  Service: Urology;  Laterality: Right;   WISDOM TOOTH EXTRACTION      Family History  Problem Relation Age of Onset   Mental illness Neg Hx    Social History:  reports that she quit smoking about 9 years ago. Her smoking use included cigarettes. She started smoking about 29 years ago. She has been exposed to tobacco smoke. She has never used smokeless tobacco. She reports that she does not currently use drugs. She reports that she does not drink alcohol.  Allergies:  Allergies  Allergen Reactions   No [employer] Food Allergies Other (See Comments)    RAW ONIONS=throat swells shut   Midol  [Acetaminophen ] Other (See Comments)    Heart racing when upright; nausea with lying down    Penicillins     rash   Red Dye #40 (Allura Red) Other (See Comments)    Causes HA    Medications Prior to Admission  Medication Sig Dispense Refill   acetaminophen  (TYLENOL ) 500 MG tablet Take 1,000 mg by mouth every 6 (six) hours as needed (pain.).     ALPRAZolam  (XANAX ) 1 MG tablet Take 0.5 tablets (0.5 mg total) by mouth as directed. Take 0.5 mg daily for 1-2 weeks  and 0.25 mg daily for a week and stop (Patient taking differently: Take 1 mg by mouth at bedtime. Take 0.5 mg daily for 1-2 weeks and 0.25 mg daily for a week and stop) 30 tablet    amlodipine-benazepril (LOTREL) 2.5-10 MG capsule Take 1 capsule by mouth in the morning.     hydrochlorothiazide (HYDRODIURIL) 12.5 MG tablet Take 12.5 mg by mouth in the morning.     meclizine (ANTIVERT) 25 MG tablet Take 12.5-25 mg by mouth 2 (two) times daily as needed for dizziness (vertigo).     NOVOLIN N FLEXPEN 100 UNIT/ML FlexPen Inject 28 Units into the skin in the morning.      Results for orders placed or performed during the hospital encounter of 05/18/23 (from the past 48 hours)  Glucose, capillary     Status: Abnormal   Collection Time: 05/18/23 10:38 AM  Result Value Ref Range   Glucose-Capillary 157 (H) 70 - 99 mg/dL    Comment: Glucose reference range applies only to samples taken after fasting for at least 8 hours.   No results found.  Review of Systems  Blood pressure 134/76, pulse 67, temperature 99 F (37.2 C), temperature source Oral, resp. rate 18, height 5\' 5"  (1.651 m), weight 98.7 kg, SpO2 95%. Physical Exam HENT:  Head: Normocephalic.     Mouth/Throat:     Mouth: Mucous membranes are moist.  Cardiovascular:     Rate and Rhythm: Normal rate and regular rhythm.  Pulmonary:     Effort: Pulmonary effort is normal.     Breath sounds: Normal breath sounds. No wheezing.  Abdominal:     General: Abdomen is flat.     Palpations: Abdomen is soft.     Tenderness: There is no abdominal tenderness.  Skin:    General: Skin is warm.  Neurological:     Mental Status: She is oriented to person, place, and time.  Psychiatric:        Mood and Affect: Mood normal.      Assessment/Plan Symptomatic cholelithiasis -for laparoscopic cholecystectomy.  Procedure, risks, & benefits were discussed in detail again with her.  I also discussed the expected postoperative course.  She is  agreeable.  Cloyce Darby, MD 05/18/2023, 11:33 AM

## 2023-05-18 NOTE — Anesthesia Procedure Notes (Signed)
 Procedure Name: Intubation Date/Time: 05/18/2023 12:33 PM  Performed by: Raymund Calix, CRNAPre-anesthesia Checklist: Patient identified, Emergency Drugs available, Suction available and Patient being monitored Patient Re-evaluated:Patient Re-evaluated prior to induction Oxygen Delivery Method: Circle system utilized Preoxygenation: Pre-oxygenation with 100% oxygen Induction Type: IV induction Ventilation: Mask ventilation without difficulty Laryngoscope Size: Mac and 4 Grade View: Grade I Tube type: Oral Tube size: 7.0 mm Number of attempts: 1 Airway Equipment and Method: Stylet and Oral airway Placement Confirmation: ETT inserted through vocal cords under direct vision, positive ETCO2 and breath sounds checked- equal and bilateral Secured at: 21 cm Tube secured with: Tape Dental Injury: Teeth and Oropharynx as per pre-operative assessment

## 2023-05-19 ENCOUNTER — Encounter (HOSPITAL_COMMUNITY): Payer: Self-pay | Admitting: General Surgery

## 2023-05-19 LAB — SURGICAL PATHOLOGY

## 2023-08-03 DIAGNOSIS — G629 Polyneuropathy, unspecified: Secondary | ICD-10-CM | POA: Diagnosis not present

## 2023-08-03 DIAGNOSIS — B370 Candidal stomatitis: Secondary | ICD-10-CM | POA: Diagnosis not present

## 2023-08-03 DIAGNOSIS — Z794 Long term (current) use of insulin: Secondary | ICD-10-CM | POA: Diagnosis not present

## 2023-08-03 DIAGNOSIS — E119 Type 2 diabetes mellitus without complications: Secondary | ICD-10-CM | POA: Diagnosis not present

## 2023-08-03 DIAGNOSIS — I10 Essential (primary) hypertension: Secondary | ICD-10-CM | POA: Diagnosis not present

## 2023-08-03 DIAGNOSIS — R451 Restlessness and agitation: Secondary | ICD-10-CM | POA: Diagnosis not present

## 2023-08-03 DIAGNOSIS — E785 Hyperlipidemia, unspecified: Secondary | ICD-10-CM | POA: Diagnosis not present

## 2023-08-03 DIAGNOSIS — F419 Anxiety disorder, unspecified: Secondary | ICD-10-CM | POA: Diagnosis not present

## 2023-09-08 DIAGNOSIS — E119 Type 2 diabetes mellitus without complications: Secondary | ICD-10-CM | POA: Diagnosis not present

## 2023-09-08 DIAGNOSIS — B3731 Acute candidiasis of vulva and vagina: Secondary | ICD-10-CM | POA: Diagnosis not present

## 2023-09-08 DIAGNOSIS — B370 Candidal stomatitis: Secondary | ICD-10-CM | POA: Diagnosis not present

## 2023-09-21 DIAGNOSIS — I10 Essential (primary) hypertension: Secondary | ICD-10-CM | POA: Diagnosis not present

## 2023-09-21 DIAGNOSIS — E66812 Obesity, class 2: Secondary | ICD-10-CM | POA: Diagnosis not present

## 2023-09-21 DIAGNOSIS — E782 Mixed hyperlipidemia: Secondary | ICD-10-CM | POA: Diagnosis not present

## 2023-09-21 DIAGNOSIS — E1165 Type 2 diabetes mellitus with hyperglycemia: Secondary | ICD-10-CM | POA: Diagnosis not present

## 2023-09-21 DIAGNOSIS — Z6836 Body mass index (BMI) 36.0-36.9, adult: Secondary | ICD-10-CM | POA: Diagnosis not present

## 2023-09-21 DIAGNOSIS — Z794 Long term (current) use of insulin: Secondary | ICD-10-CM | POA: Diagnosis not present

## 2023-09-21 LAB — PROTEIN / CREATININE RATIO, URINE
Albumin, U: 14
Creatinine, Urine: 73.3

## 2023-09-21 LAB — MICROALBUMIN / CREATININE URINE RATIO: Microalb Creat Ratio: 19.1

## 2023-09-23 ENCOUNTER — Ambulatory Visit (INDEPENDENT_AMBULATORY_CARE_PROVIDER_SITE_OTHER)

## 2023-09-23 VITALS — BP 122/76 | HR 79 | Temp 97.9°F | Ht 65.0 in | Wt 217.1 lb

## 2023-09-23 DIAGNOSIS — Z130 Encounter for screening for diseases of the blood and blood-forming organs and certain disorders involving the immune mechanism: Secondary | ICD-10-CM

## 2023-09-23 DIAGNOSIS — Z13228 Encounter for screening for other metabolic disorders: Secondary | ICD-10-CM | POA: Diagnosis not present

## 2023-09-23 DIAGNOSIS — F331 Major depressive disorder, recurrent, moderate: Secondary | ICD-10-CM

## 2023-09-23 DIAGNOSIS — B370 Candidal stomatitis: Secondary | ICD-10-CM | POA: Insufficient documentation

## 2023-09-23 DIAGNOSIS — Z1231 Encounter for screening mammogram for malignant neoplasm of breast: Secondary | ICD-10-CM

## 2023-09-23 DIAGNOSIS — E119 Type 2 diabetes mellitus without complications: Secondary | ICD-10-CM | POA: Insufficient documentation

## 2023-09-23 DIAGNOSIS — Z1329 Encounter for screening for other suspected endocrine disorder: Secondary | ICD-10-CM | POA: Diagnosis not present

## 2023-09-23 DIAGNOSIS — Z794 Long term (current) use of insulin: Secondary | ICD-10-CM | POA: Diagnosis not present

## 2023-09-23 DIAGNOSIS — Z1321 Encounter for screening for nutritional disorder: Secondary | ICD-10-CM

## 2023-09-23 DIAGNOSIS — I10 Essential (primary) hypertension: Secondary | ICD-10-CM | POA: Insufficient documentation

## 2023-09-23 DIAGNOSIS — F410 Panic disorder [episodic paroxysmal anxiety] without agoraphobia: Secondary | ICD-10-CM | POA: Insufficient documentation

## 2023-09-23 DIAGNOSIS — Z0000 Encounter for general adult medical examination without abnormal findings: Secondary | ICD-10-CM | POA: Insufficient documentation

## 2023-09-23 MED ORDER — FLUCONAZOLE 150 MG PO TABS: 150.0000 mg | ORAL_TABLET | ORAL | 0 refills | Status: AC

## 2023-09-23 MED ORDER — AMLODIPINE BESY-BENAZEPRIL HCL 2.5-10 MG PO CAPS: 1.0000 | ORAL_CAPSULE | Freq: Every morning | ORAL | 2 refills | Status: AC

## 2023-09-23 MED ORDER — HYDROXYZINE PAMOATE 50 MG PO CAPS: 50.0000 mg | ORAL_CAPSULE | Freq: Three times a day (TID) | ORAL | 0 refills | Status: DC | PRN

## 2023-09-23 MED ORDER — HYDROCHLOROTHIAZIDE 12.5 MG PO TABS: 12.5000 mg | ORAL_TABLET | Freq: Every morning | ORAL | 2 refills | Status: AC

## 2023-09-23 MED ORDER — ALPRAZOLAM 1 MG PO TABS
1.0000 mg | ORAL_TABLET | Freq: Every day | ORAL | 0 refills | Status: DC
Start: 2023-09-23 — End: 2023-10-20
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
## 2023-09-23 NOTE — Assessment & Plan Note (Signed)
 Switched to Lantus for better glycemic control. Monitoring with Dexcom G6. - Continue Lantus 30 units in the morning. - Adjust Lantus to 32 units if morning glucose >130 mg/dL. - Start Mounjaro when available. - Add Dexcom G6 to medication list. - Continue regular follow up with endocrinology. Dr. Lonni Hoover.

## 2023-09-23 NOTE — Assessment & Plan Note (Signed)
 Recurrent oral candidiasis. Discussed concerns about liver impact with daily fluconazole . Oral treatment effective. - Refill fluconazole . - Take fluconazole  every third day to minimize liver impact. - Discussed liver impact of daily fluconazole . -Advised to follow up if symptoms are not improving
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
## 2023-09-23 NOTE — Assessment & Plan Note (Signed)
 Labs this week or next week (see orders). Answered all patient questions. Went over and encouraged/ordered age-appropriate health screenings to include pap smear, mammogram, colonoscopy, flu vaccine. Patient declined colon cancer screening and pap smear. Agreeable to mammogram, order placed today. Vaccines declined today. Encouraged patient to aim for 150+ minutes of physical activity per week or just increase their physical activity in general in combination with a well balanced diet that prioritizes protein and fiber to support a healthy lifestyle.  Patient verbalized understanding and was in agreement with the plan.

## 2023-09-23 NOTE — Patient Instructions (Signed)
 VISIT SUMMARY: Today, you came in to establish care and get refills for your medications. We discussed your PTSD, panic attacks, type 2 diabetes, hypertension, and recurrent oral and ocular candidiasis. We also reviewed your preventive health maintenance needs.  YOUR PLAN: -TYPE 2 DIABETES MELLITUS: Type 2 diabetes is a condition where your body does not use insulin properly, leading to high blood sugar levels. You will continue taking Lantus at 30 units in the morning and adjust to 32 units if your morning blood sugar is over 130 mg/dL. You will start Mounjaro when it becomes available. We have added Dexcom G6 to your medication list for continuous glucose monitoring.  -ESSENTIAL HYPERTENSION: Hypertension is high blood pressure, which can lead to serious health problems if not managed. Your blood pressure is well-controlled with your current medications. Continue taking amlodipine  2.5 mg, benazepril  10 mg, and hydrochlorothiazide  12.5 mg. We have refilled your antihypertensive medications for 90 days.  -RECURRENT ORAL AND CUTANEOUS CANDIDIASIS: Candidiasis is a yeast infection that can affect the mouth and skin. You will continue taking fluconazole  for two weeks and then take it every third day to minimize liver impact. We discussed the potential liver effects of daily fluconazole  use.  -PANIC DISORDER: Panic disorder involves sudden episodes of intense fear or anxiety. You will continue taking Xanax  1 mg at night and have been prescribed hydroxyzine  for daytime anxiety. We discussed the sedative effects of hydroxyzine  and advised you to try it at home first.  INSTRUCTIONS: Please follow up with us  if you have any issues with your medications or if your symptoms worsen. We recommend scheduling a mammogram and considering a colonoscopy for preventive health maintenance.  If you have any problems before your next visit feel free to message me via MyChart (minor issues or questions) or call the office,  otherwise you may reach out to schedule an office visit.  Thank you! Saddie Sacks, PA-C

## 2023-09-23 NOTE — Assessment & Plan Note (Addendum)
 BP Goal <130/80. Well within goal. - Continue amlodipine  2.5 mg, benazepril  10 mg, hydrochlorothiazide  12.5 mg. - Refill antihypertensives for 90 days.

## 2023-10-01 ENCOUNTER — Other Ambulatory Visit

## 2023-10-01 DIAGNOSIS — Z1321 Encounter for screening for nutritional disorder: Secondary | ICD-10-CM

## 2023-10-01 DIAGNOSIS — Z13228 Encounter for screening for other metabolic disorders: Secondary | ICD-10-CM | POA: Diagnosis not present

## 2023-10-01 DIAGNOSIS — Z130 Encounter for screening for diseases of the blood and blood-forming organs and certain disorders involving the immune mechanism: Secondary | ICD-10-CM | POA: Diagnosis not present

## 2023-10-01 DIAGNOSIS — Z1329 Encounter for screening for other suspected endocrine disorder: Secondary | ICD-10-CM | POA: Diagnosis not present

## 2023-10-02 ENCOUNTER — Ambulatory Visit: Payer: Self-pay

## 2023-10-02 ENCOUNTER — Ambulatory Visit

## 2023-10-02 LAB — COMPREHENSIVE METABOLIC PANEL WITH GFR
ALT: 24 IU/L (ref 0–32)
AST: 23 IU/L (ref 0–40)
Albumin: 4.4 g/dL (ref 3.8–4.9)
Alkaline Phosphatase: 118 IU/L (ref 49–135)
BUN/Creatinine Ratio: 13 (ref 9–23)
BUN: 9 mg/dL (ref 6–24)
Bilirubin Total: 0.5 mg/dL (ref 0.0–1.2)
CO2: 26 mmol/L (ref 20–29)
Calcium: 9.8 mg/dL (ref 8.7–10.2)
Chloride: 99 mmol/L (ref 96–106)
Creatinine, Ser: 0.68 mg/dL (ref 0.57–1.00)
Globulin, Total: 3 g/dL (ref 1.5–4.5)
Glucose: 129 mg/dL — ABNORMAL HIGH (ref 70–99)
Potassium: 4.8 mmol/L (ref 3.5–5.2)
Sodium: 140 mmol/L (ref 134–144)
Total Protein: 7.4 g/dL (ref 6.0–8.5)
eGFR: 101 mL/min/1.73 (ref >59)

## 2023-10-02 LAB — CBC WITH DIFFERENTIAL/PLATELET
Basophils Absolute: 0.1 x10E3/uL (ref 0.0–0.2)
Basos: 1 %
EOS (ABSOLUTE): 0.2 x10E3/uL (ref 0.0–0.4)
Eos: 2 %
Hematocrit: 43.4 % (ref 34.0–46.6)
Hemoglobin: 14.1 g/dL (ref 11.1–15.9)
Immature Grans (Abs): 0.1 x10E3/uL (ref 0.0–0.1)
Immature Granulocytes: 1 %
Lymphocytes Absolute: 2.3 x10E3/uL (ref 0.7–3.1)
Lymphs: 30 %
MCH: 31.5 pg (ref 26.6–33.0)
MCHC: 32.5 g/dL (ref 31.5–35.7)
MCV: 97 fL (ref 79–97)
Monocytes Absolute: 0.7 x10E3/uL (ref 0.1–0.9)
Monocytes: 10 %
Neutrophils Absolute: 4.4 x10E3/uL (ref 1.4–7.0)
Neutrophils: 56 %
Platelets: 178 x10E3/uL (ref 150–450)
RBC: 4.48 x10E6/uL (ref 3.77–5.28)
RDW: 12.5 % (ref 11.7–15.4)
WBC: 7.7 x10E3/uL (ref 3.4–10.8)

## 2023-10-02 LAB — LIPID PANEL
Chol/HDL Ratio: 3.6 ratio (ref 0.0–4.4)
Cholesterol, Total: 135 mg/dL (ref 100–199)
HDL: 37 mg/dL — ABNORMAL LOW (ref >39)
LDL Chol Calc (NIH): 77 mg/dL (ref 0–99)
Triglycerides: 116 mg/dL (ref 0–149)
VLDL Cholesterol Cal: 21 mg/dL (ref 5–40)

## 2023-10-02 LAB — VITAMIN D 25 HYDROXY (VIT D DEFICIENCY, FRACTURES): Vit D, 25-Hydroxy: 33.9 ng/mL (ref 30.0–100.0)

## 2023-10-02 LAB — TSH: TSH: 0.628 u[IU]/mL (ref 0.450–4.500)

## 2023-10-02 LAB — HEMOGLOBIN A1C
Est. average glucose Bld gHb Est-mCnc: 194 mg/dL
Hgb A1c MFr Bld: 8.4 % — ABNORMAL HIGH (ref 4.8–5.6)

## 2023-10-08 ENCOUNTER — Other Ambulatory Visit: Payer: Self-pay

## 2023-10-08 ENCOUNTER — Telehealth: Payer: Self-pay

## 2023-10-08 DIAGNOSIS — N6320 Unspecified lump in the left breast, unspecified quadrant: Secondary | ICD-10-CM

## 2023-10-08 NOTE — Telephone Encounter (Unsigned)
 Copied from CRM [id number]. Topic: General - Other >> Oct 08, 2023  3:02 PM Tiffini S wrote: Reason for CRM: Patient cancelled appointment for  MM 3D SCREENING MAMMOGRAM BILATERAL BREAST on 10/02/23- now she feels a knot on the left breast- wants to reschedule mammogram  Please call the patient at [phone]. Thank you.

## 2023-10-08 NOTE — Telephone Encounter (Signed)
 Orders in for diagnostic mammo

## 2023-10-09 NOTE — Telephone Encounter (Signed)
 Sent My-Chart message

## 2023-10-12 ENCOUNTER — Ambulatory Visit: Admission: RE | Admit: 2023-10-12 | Discharge: 2023-10-12 | Disposition: A | Source: Ambulatory Visit

## 2023-10-12 ENCOUNTER — Ambulatory Visit
Admission: RE | Admit: 2023-10-12 | Discharge: 2023-10-12 | Disposition: A | Discharge status: Home / Self Care | Source: Ambulatory Visit

## 2023-10-12 DIAGNOSIS — Z1231 Encounter for screening mammogram for malignant neoplasm of breast: Secondary | ICD-10-CM | POA: Diagnosis not present

## 2023-10-12 DIAGNOSIS — N6320 Unspecified lump in the left breast, unspecified quadrant: Secondary | ICD-10-CM | POA: Diagnosis not present

## 2023-10-12 DIAGNOSIS — R92313 Mammographic fatty tissue density, bilateral breasts: Secondary | ICD-10-CM | POA: Diagnosis not present

## 2023-10-16 ENCOUNTER — Other Ambulatory Visit: Payer: Self-pay

## 2023-10-16 DIAGNOSIS — F331 Major depressive disorder, recurrent, moderate: Secondary | ICD-10-CM

## 2023-10-22 ENCOUNTER — Encounter: Payer: Self-pay | Admitting: *Deleted

## 2023-10-22 NOTE — Progress Notes (Signed)
 Senie Lanese                                          MRN: [id number]   10/22/2023   The [facility] Quality Team Specialist reviewed this patient medical record for the purposes of chart review for care gap closure. The following were reviewed: abstraction for care gap closure-kidney health evaluation for diabetes:eGFR  and uACR.    [facility] Quality Team

## 2023-10-22 NOTE — Progress Notes (Signed)
 Elizabeth Daniel                                          MRN: [id number]   10/22/2023   The [facility] Quality Team Specialist reviewed this patient medical record for the purposes of chart review for care gap closure. The following were reviewed: chart review for care gap closure-glycemic status assessment. A1C noncompliant   [facility] Quality Team

## 2023-11-10 ENCOUNTER — Other Ambulatory Visit: Payer: Self-pay

## 2023-11-12 NOTE — Progress Notes (Signed)
 Elizabeth Daniel                                          MRN: [id number]   11/12/2023   The [facility] Quality Team Specialist reviewed this patient medical record for the purposes of chart review for care gap closure. The following were reviewed: chart review for care gap closure-glycemic status assessment.    [facility] Quality Team

## 2023-11-19 ENCOUNTER — Other Ambulatory Visit: Payer: Self-pay

## 2023-11-19 DIAGNOSIS — F331 Major depressive disorder, recurrent, moderate: Secondary | ICD-10-CM

## 2023-12-22 ENCOUNTER — Other Ambulatory Visit: Payer: Self-pay | Admitting: Family Medicine

## 2023-12-22 DIAGNOSIS — F331 Major depressive disorder, recurrent, moderate: Secondary | ICD-10-CM

## 2024-01-04 ENCOUNTER — Other Ambulatory Visit

## 2024-01-04 ENCOUNTER — Other Ambulatory Visit: Payer: Self-pay

## 2024-01-04 ENCOUNTER — Telehealth: Payer: Self-pay

## 2024-01-04 DIAGNOSIS — Z1321 Encounter for screening for nutritional disorder: Secondary | ICD-10-CM

## 2024-01-04 DIAGNOSIS — E119 Type 2 diabetes mellitus without complications: Secondary | ICD-10-CM

## 2024-01-04 NOTE — Telephone Encounter (Signed)
 Copied from CRM [id number]. Topic: Clinical - Lab/Test Results >> Jan 04, 2024 10:40 AM Delon DASEN wrote: Reason for CRM: Patient had A1c drawn at [facility] on 12/16 and A1c was 6.6 at that time, it was ordered by Dr Carlin- he raised the tirzepatide (MOUNJARO) to 5 MG- asking if she needs any further labs done at this time [phone]  Called and spoke with patient. She's under the weather at the moment; missed lab appointment today. Patient will call the office back when she's feeling better to schedule missed appointment.

## 2024-01-15 ENCOUNTER — Other Ambulatory Visit: Payer: Self-pay | Admitting: Family Medicine

## 2024-01-21 ENCOUNTER — Telehealth: Payer: Self-pay

## 2024-01-21 ENCOUNTER — Other Ambulatory Visit: Payer: Self-pay | Admitting: Family Medicine

## 2024-01-21 DIAGNOSIS — F331 Major depressive disorder, recurrent, moderate: Secondary | ICD-10-CM

## 2024-01-21 MED ORDER — DEXCOM G6 SENSOR MISC: 1.0000 | 0 refills | Status: DC

## 2024-01-21 NOTE — Telephone Encounter (Unsigned)
 Copied from CRM #[id number]. Topic: Clinical - Medication Refill >> Jan 21, 2024 10:41 AM Zebedee SAUNDERS wrote: Medication: Continuous Glucose Sensor (DEXCOM G6 SENSOR) MISC Continuous Glucose Transmitter (DEXCOM G6 TRANSMITTER) MISC   Has the patient contacted their pharmacy? Yes (Agent: If no, request that the patient contact the pharmacy for the refill. If patient does not wish to contact the pharmacy document the reason why and proceed with request.) (Agent: If yes, when and what did the pharmacy advise?)Pharmacy need script  This is the patient's preferred pharmacy:  [facility] - [place], [place] - [address] [place] [address] Phone: [phone] Fax: [phone]  Is this the correct pharmacy for this prescription? Yes If no, delete pharmacy and type the correct one.   Has the prescription been filled recently? Yes  Is the patient out of the medication? Yes  Has the patient been seen for an appointment in the last year OR does the patient have an upcoming appointment? Yes  Can we respond through MyChart? Yes  Agent: Please be advised that Rx refills may take up to 3 business days. We ask that you follow-up with your pharmacy.

## 2024-01-21 NOTE — Telephone Encounter (Signed)
 Copied from CRM [id number]. Topic: Clinical - Medication Prior Auth >> Jan 21, 2024 10:44 AM Zebedee SAUNDERS wrote: Reason for CRM: Pt needs prior authorization for Continuous Glucose Sensor (DEXCOM G6 SENSOR) MISC, Continuous Glucose Transmitter (DEXCOM G6 TRANSMITTER) MISC and pt has [employer]. [facility] - [address] [place] [address] Phone: [phone] Fax: [phone]

## 2024-01-25 ENCOUNTER — Telehealth: Payer: Self-pay

## 2024-01-25 ENCOUNTER — Other Ambulatory Visit: Payer: Self-pay

## 2024-01-25 DIAGNOSIS — E119 Type 2 diabetes mellitus without complications: Secondary | ICD-10-CM

## 2024-01-25 MED ORDER — DEXCOM G6 TRANSMITTER MISC: 0 refills | Status: AC

## 2024-01-25 MED ORDER — DEXCOM G6 SENSOR MISC: 1.0000 | 0 refills | Status: AC

## 2024-01-25 NOTE — Telephone Encounter (Unsigned)
 Copied from CRM [id number]. Topic: Clinical - Medication Prior Auth >> Jan 25, 2024  2:27 PM Viola F wrote: Reason for CRM: Patient called to follow up on the prior authorization for the continuous Glucose Sensor (DEXCOM G6 SENSOR) MISC and the Continuous Glucose Transmitter (DEXCOM G6 TRANSMITTER) MISC. It needs to be marked as URGENT and submitted to her new [employer] on file. Patient would like a call to let her know when PA is submitted [phone]

## 2024-01-25 NOTE — Telephone Encounter (Signed)
 I have resent her script which will trigger the PA. Can we confirm with RX team that this is being handled?

## 2024-01-26 NOTE — Telephone Encounter (Signed)
 I have sent the PA request that came via fax to the auth team.

## 2024-01-27 ENCOUNTER — Telehealth: Payer: Self-pay

## 2024-01-27 ENCOUNTER — Other Ambulatory Visit (HOSPITAL_COMMUNITY): Payer: Self-pay

## 2024-01-27 NOTE — Telephone Encounter (Signed)
 Pharmacy Patient Advocate Encounter   Received notification from Pt Calls Messages that prior authorization for Continuous Glucose Sensor (DEXCOM G6 SENSOR) MISC  is required/requested.   Insurance verification completed.   The patient is insured through [employer].   Per test claim: PA required; PA submitted to above mentioned insurance via Latent Key/confirmation #/EOC [facility] Status is pending

## 2024-01-27 NOTE — Telephone Encounter (Signed)
 Pharmacy Patient Advocate Encounter   Received notification from Pt Calls Messages that prior authorization for Continuous Glucose Transmitter (DEXCOM G6 TRANSMITTER) MISC  is required/requested.   Insurance verification completed.   The patient is insured through [employer].   Per test claim: PA required; PA submitted to above mentioned insurance via Latent Key/confirmation #/EOC [facility] Status is pending

## 2024-02-03 ENCOUNTER — Other Ambulatory Visit (HOSPITAL_COMMUNITY): Payer: Self-pay

## 2024-02-03 NOTE — Telephone Encounter (Deleted)
 Pharmacy Patient Advocate Encounter  Received notification from [facility] that Prior Authorization for Continuous Glucose Transmitter (DEXCOM G6 TRANSMITTER) MISC  has been APPROVED from 02/02/2024 to 02/01/2025. Ran test claim, Copay is $45.00 SEE TEST BILLING CLAIM BELOW. This test claim was processed through [facility]- copay amounts may vary at other pharmacies due to pharmacy/plan contracts, or as the patient moves through the different stages of their insurance plan.   PA #/Case ID/Reference #: [id number]

## 2024-02-03 NOTE — Telephone Encounter (Signed)
 Pharmacy Patient Advocate Encounter  Received notification from [facility] that Prior Authorization for Transmitter [facility] G6 TRANSMITTER) MISC   has been APPROVED from 02/02/2024 to 02/01/2025. Ran test claim, Copay is $45.00 SEE TEST CLAIM BELOW. This test claim was processed through [facility]- copay amounts may vary at other pharmacies due to pharmacy/plan contracts, or as the patient moves through the different stages of their insurance plan.

## 2024-02-03 NOTE — Telephone Encounter (Signed)
 Pharmacy Patient Advocate Encounter  Received notification from [facility] that Prior Authorization for Sensor [facility] G6 SENSOR) MISC  has been APPROVED from 02/02/2024 to 02/01/2025. Ran test claim, Copay is $15.00 SEE TEST BILLING BELOW . This test claim was processed through [facility]- copay amounts may vary at other pharmacies due to pharmacy/plan contracts, or as the patient moves through the different stages of their insurance plan.

## 2024-02-03 NOTE — Telephone Encounter (Deleted)
 Pharmacy Patient Advocate Encounter  Received notification from [facility] that Prior Authorization for Dexcom G6 Senso  has been APPROVED from 02/02/2024 to 02/01/2025. Ran test claim, Copay is $15.00 BILL CLAIM BELOW. This test claim was processed through [facility]- copay amounts may vary at other pharmacies due to pharmacy/plan contracts, or as the patient moves through the different stages of their insurance plan.   PA #/Case ID/Reference #: [id number]

## 2024-02-04 NOTE — Telephone Encounter (Signed)
 Pt informed of below.

## 2024-03-15 ENCOUNTER — Other Ambulatory Visit

## 2024-03-22 ENCOUNTER — Ambulatory Visit
